# Patient Record
Sex: Female | Born: 1970 | Race: Black or African American | Hispanic: No | Marital: Single | Smoking: Never smoker
Health system: Southern US, Community
[De-identification: ages and names within clinical notes are randomized; demographics above are authoritative.]

## PROBLEM LIST (undated history)

## (undated) DIAGNOSIS — I10 Essential (primary) hypertension: Secondary | ICD-10-CM

## (undated) DIAGNOSIS — J45909 Unspecified asthma, uncomplicated: Secondary | ICD-10-CM

## (undated) DIAGNOSIS — E559 Vitamin D deficiency, unspecified: Secondary | ICD-10-CM

## (undated) DIAGNOSIS — K219 Gastro-esophageal reflux disease without esophagitis: Secondary | ICD-10-CM

## (undated) DIAGNOSIS — T7840XA Allergy, unspecified, initial encounter: Secondary | ICD-10-CM

## (undated) DIAGNOSIS — R7303 Prediabetes: Secondary | ICD-10-CM

## (undated) DIAGNOSIS — E079 Disorder of thyroid, unspecified: Secondary | ICD-10-CM

## (undated) DIAGNOSIS — E119 Type 2 diabetes mellitus without complications: Secondary | ICD-10-CM

## (undated) DIAGNOSIS — E785 Hyperlipidemia, unspecified: Secondary | ICD-10-CM

## (undated) HISTORY — DX: Disorder of thyroid, unspecified: E07.9

## (undated) HISTORY — DX: Hyperlipidemia, unspecified: E78.5

## (undated) HISTORY — PX: OTHER SURGICAL HISTORY: SHX169

## (undated) HISTORY — PX: ABDOMINAL HYSTERECTOMY: SHX81

## (undated) HISTORY — DX: Allergy, unspecified, initial encounter: T78.40XA

## (undated) HISTORY — DX: Type 2 diabetes mellitus without complications: E11.9

## (undated) HISTORY — DX: Gastro-esophageal reflux disease without esophagitis: K21.9

## (undated) HISTORY — PX: WISDOM TOOTH EXTRACTION: SHX21

## (undated) HISTORY — DX: Vitamin D deficiency, unspecified: E55.9

## (undated) HISTORY — PX: ROUX-EN-Y GASTRIC BYPASS: SHX1104

## (undated) HISTORY — DX: Unspecified asthma, uncomplicated: J45.909

## (undated) HISTORY — DX: Essential (primary) hypertension: I10

---
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
## 2019-01-06 ENCOUNTER — Ambulatory Visit: Payer: 59 | Admitting: Sports Medicine

## 2019-01-06 ENCOUNTER — Other Ambulatory Visit: Payer: Self-pay

## 2019-01-06 DIAGNOSIS — M7742 Metatarsalgia, left foot: Secondary | ICD-10-CM | POA: Diagnosis not present

## 2019-01-06 NOTE — Progress Notes (Signed)
[name] Boyd - 48 y.o. female MRN [id number]  Date of birth: Mar 23, 1971   Chief Complaint: Left foot pain  HPI:  48 year old female who presents for 1 year history of intermittent left foot pain.  Pain originally was limited to first MTP joint.  She states that this was a dull ache which intermittently became a pins and needle sensation.  She reports that a few weeks ago the joint became red and "shiny", with increased swelling.  This was more painful than usual.  Shortly thereafter she developed lateral foot pain around her fifth metatarsal head.  Patient states that the pain occurs with activity, but occasionally does hurt her at rest and will often sleep.  She has tried ibuprofen and icy hot which have improved her pain minimally.  Patient also has question about nail discoloration and thickening second toe left side.  States this started several months ago.  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  ROS:     See HPI  PERTINENT  PMH / PSH FH / / SH:  Past Medical, Surgical, Social, and Family History Reviewed & Updated in the EMR.  Pertinent findings include:  Hypothyroidism Diabetes Hypertension  OBJECTIVE: BP 134/76   Ht 5\' 7"  (1.702 m)   Wt 258 lb (117 kg)   BMI 40.41 kg/m   Physical Exam:  Vital signs are reviewed.  GEN: Alert and oriented, NAD Pulm: Breathing unlabored PSY: normal mood, congruent affect  MSK: Left foot Inspection: Hallux valgus noted first MTP.  Notable loss of transverse arch.  No increased swelling.  No erythema noted to first MTP. Palpation: Pain relief with deep Palpation to first MTP.  Notable pain to palpation of fifth tarsal head.  Mild crepitus noted with flexion extension of fourth and fifth metatarsal. Range of motion: Range of motion intact to foot flexion, extension, inversion, eversion.  No limited range of motion to abduction, abduction, extension, flexion first toe. Strength: 5/5 strength foot flexion,  extension, inversion, eversion 5 out of 5 strength great toe flexion, extension Stability: Joint laxity on anterior drawer test. Neurovascular: No focal neurologic deficit.  Skin warm and dry.  Cap refill less than 2 seconds. Special test: Joint laxity with anterior drawer, negative talar tilt  Right foot Inspection: Hallux valgus noted first MTP, more mild than left foot.  No loss of transverse arch. No increased swelling.  No erythema noted Palpation: no tenderness or crepitus appreciated on exam Range of motion: Range of motion intact to foot flexion, extension, inversion, eversion.  No limited range of motion to abduction, abduction, extension, flexion first toe. Strength: 5/5 strength foot flexion, extension, inversion, eversion 5 out of 5 strength great toe flexion, extension Stability: no instability noted Neurovascular: No focal neurologic deficit.  Skin warm and dry.  Cap refill less than 2 seconds. Special test: Negative anterior drawer, negative talar tilt  Gait: Notable out toeing of feet with walking.  No Trendelenburg.  ASSESSMENT & PLAN:  1. Left Foot pain Given crepitus on exam and history patient with likely metatarsalgia secondary to osteoarthritis.  Patient symptomatic on first, fourth, fifth toes but likely to soon develop pain in second and third toes as well.  Fitted patient with metatarsal support and Hapad.  Patient to call in 1 week if not getting any support or if interested in custom orthotic.  Patient to follow-up in 1 month for reevaluation. - follow up in 1 month - fitted with metatarsal support  and green insole - topical liniments and anti-inflammatory as needed - patient to call in 1 week if no relief, or wants custom orthotic made  2. Nail overgrowth Likely onychomycosis secondary to moisture tension with her work boots.  Patient to follow-up with PCP for further management.  3.  History of podagra  Unclear if related to gout flare.  History of red  swollen joint with increased pain that resolved certainly makes this etiology a possibility.  Patient to follow-up with PCP if this develops again.  Guadalupe Dawn MD PGY-2 Family Medicine Resident  Fellow Addendum:  I evaluated the patient alongside the resident as noted above.  I agree with the history and assessment plan as noted above.  I agree the physical exam as noted above.    Patient with pain over the metatarsal heads, secondary to metatarsalgia and likely underlying osteoarthritis.  We will see her back for custom orthotics if the inserts help with her foot pain.  [facility] Fellow 01/06/2019 10:03 AM  I was the preceptor for this visit and available for immediate consultation Shellia Cleverly, DO

## 2019-01-07 ENCOUNTER — Encounter: Payer: Self-pay | Admitting: Sports Medicine

## 2019-02-03 ENCOUNTER — Ambulatory Visit: Payer: 59 | Admitting: Sports Medicine

## 2019-02-08 ENCOUNTER — Other Ambulatory Visit: Payer: Self-pay

## 2019-02-08 ENCOUNTER — Encounter: Payer: Self-pay | Admitting: Sports Medicine

## 2019-02-08 ENCOUNTER — Ambulatory Visit (INDEPENDENT_AMBULATORY_CARE_PROVIDER_SITE_OTHER): Payer: 59 | Admitting: Sports Medicine

## 2019-02-08 VITALS — Temp 97.6°F | Ht 67.0 in | Wt 260.0 lb

## 2019-02-08 DIAGNOSIS — M7742 Metatarsalgia, left foot: Secondary | ICD-10-CM

## 2019-02-08 NOTE — Progress Notes (Addendum)
  Virtual Visit via Video Note  I connected with [name] Boyd on 02/08/19 at 10:15 AM EDT by a video enabled telemedicine application and verified that I am speaking with the correct person using two identifiers.   I discussed the limitations of evaluation and management by telemedicine and the availability of in person appointments. The patient expressed understanding and agreed to proceed.  History of Present Illness: [name] presents today for follow-up on left foot pain.  She was last seen in our office on March 11.  Diagnosed with metatarsalgia, she was given a metatarsal pad which was initially helpful but her pain has now returned.  She is localizing pain over the MTP joint as well as laterally along the fifth metatarsal.  She does endorse some mild swelling at the first MTP joint as well.  She takes 800 mg of ibuprofen once a day which is helpful.  She also ices occasionally which tends to be helpful.  We had previously discussed the possibility of custom orthotics at the time of her last office visit.    Observations/Objective: Unable to assess due to the nature of this visit   Assessment and Plan: Persistent left foot pain secondary to metatarsalgia versus first MTP OA  At this point in time, I think it is reasonable to get an x-ray of her left foot specifically to evaluate for any possible first MTP OA.  Phone follow-up with those results when available.  I still think she would benefit from custom orthotics with a metatarsal pad at some point in the near future.  She may continue with 800 mg of ibuprofen prn.  I did reassure her that she may take a second dose later in the day if needed.  She will also continue icing as needed.   Follow Up Instructions:    I discussed the assessment and treatment plan with the patient. The patient was provided an opportunity to ask questions and all were answered. The patient agreed with the plan and demonstrated an understanding of the instructions.    The patient was advised to call back or seek an in-person evaluation if the symptoms worsen or if the condition fails to improve as anticipated.    Shellia Cleverly, DO    Addendum: X-ray reviewed.  There are degenerative changes at the first MTP joint.  Patient's symptoms are currently tolerable.  I did discuss the possibility of a cortisone injection in the future if symptoms warrant.  Follow-up as needed.

## 2019-02-09 ENCOUNTER — Ambulatory Visit
Admission: RE | Admit: 2019-02-09 | Discharge: 2019-02-09 | Disposition: A | Discharge status: Home / Self Care | Payer: 59 | Source: Ambulatory Visit | Attending: Sports Medicine | Admitting: Sports Medicine

## 2019-02-09 DIAGNOSIS — M7742 Metatarsalgia, left foot: Secondary | ICD-10-CM

## 2019-08-29 ENCOUNTER — Other Ambulatory Visit: Payer: Self-pay

## 2019-08-29 ENCOUNTER — Emergency Department (HOSPITAL_COMMUNITY)
Admission: EM | Admit: 2019-08-29 | Discharge: 2019-08-29 | Disposition: A | Discharge status: Home / Self Care | Payer: 59 | Attending: Emergency Medicine | Admitting: Emergency Medicine

## 2019-08-29 ENCOUNTER — Encounter (HOSPITAL_COMMUNITY): Payer: Self-pay

## 2019-08-29 ENCOUNTER — Emergency Department (HOSPITAL_COMMUNITY): Payer: 59

## 2019-08-29 DIAGNOSIS — R000 Tachycardia, unspecified: Secondary | ICD-10-CM | POA: Insufficient documentation

## 2019-08-29 DIAGNOSIS — R002 Palpitations: Secondary | ICD-10-CM

## 2019-08-29 DIAGNOSIS — R079 Chest pain, unspecified: Secondary | ICD-10-CM

## 2019-08-29 DIAGNOSIS — Z79899 Other long term (current) drug therapy: Secondary | ICD-10-CM | POA: Diagnosis not present

## 2019-08-29 DIAGNOSIS — Z7984 Long term (current) use of oral hypoglycemic drugs: Secondary | ICD-10-CM | POA: Diagnosis not present

## 2019-08-29 DIAGNOSIS — R0789 Other chest pain: Secondary | ICD-10-CM | POA: Diagnosis not present

## 2019-08-29 DIAGNOSIS — R0602 Shortness of breath: Secondary | ICD-10-CM | POA: Diagnosis not present

## 2019-08-29 LAB — CBC
HCT: 41.8 % (ref 36.0–46.0)
Hemoglobin: 12.8 g/dL (ref 12.0–15.0)
MCH: 28 pg (ref 26.0–34.0)
MCHC: 30.6 g/dL (ref 30.0–36.0)
MCV: 91.5 fL (ref 80.0–100.0)
Platelets: 224 10*3/uL (ref 150–400)
RBC: 4.57 MIL/uL (ref 3.87–5.11)
RDW: 12.7 % (ref 11.5–15.5)
WBC: 3.7 10*3/uL — ABNORMAL LOW (ref 4.0–10.5)
nRBC: 0 % (ref 0.0–0.2)

## 2019-08-29 LAB — BASIC METABOLIC PANEL
Anion gap: 7 (ref 5–15)
BUN: 13 mg/dL (ref 6–20)
CO2: 27 mmol/L (ref 22–32)
Calcium: 9.3 mg/dL (ref 8.9–10.3)
Chloride: 103 mmol/L (ref 98–111)
Creatinine, Ser: 0.78 mg/dL (ref 0.44–1.00)
GFR calc Af Amer: >60 mL/min (ref >60)
GFR calc non Af Amer: >60 mL/min (ref >60)
Glucose, Bld: 113 mg/dL — ABNORMAL HIGH (ref 70–99)
Potassium: 4.5 mmol/L (ref 3.5–5.1)
Sodium: 137 mmol/L (ref 135–145)

## 2019-08-29 LAB — TROPONIN I (HIGH SENSITIVITY)
Troponin I (High Sensitivity): 2 ng/L (ref <18)
Troponin I (High Sensitivity): 2 ng/L (ref <18)

## 2019-08-29 LAB — D-DIMER, QUANTITATIVE: D-Dimer, Quant: 0.56 ug/mL-FEU — ABNORMAL HIGH (ref 0.00–0.50)

## 2019-08-29 MED ORDER — SODIUM CHLORIDE (PF) 0.9 % IJ SOLN
INTRAMUSCULAR | Status: AC
  Filled 2019-08-29: qty 50

## 2019-08-29 MED ORDER — SODIUM CHLORIDE 0.9% FLUSH: 3.0000 mL | Freq: Once | INTRAVENOUS | Status: DC

## 2019-08-29 MED ORDER — IOHEXOL 350 MG/ML SOLN
100.0000 mL | Freq: Once | INTRAVENOUS | Status: AC | PRN
  Administered 2019-08-29: 100 mL via INTRAVENOUS

## 2019-08-29 NOTE — ED Notes (Signed)
Pt ambulated in room SpO2 was 95%

## 2019-08-29 NOTE — ED Provider Notes (Signed)
[facility] DEPT Provider Note   CSN: [id number] Arrival date & time: 08/29/19  [id number]     History   Chief Complaint Chief Complaint  Patient presents with   Palpitations    HPI [name] Boyd is a 48 y.o. female who presents to the ED today complaining of intermittent palpitations x 3-4 days.  Also complains of intermittent left-sided chest pains.  Reports that she initially thought she was having some indigestion so she took Tums without relief.  She states that the palpitations also make her feel slightly more short of breath especially with exertion.  She has noticed that the chest pain appears to be exacerbated with deep inspiration.  She states that [facility] her here for further evaluation.  Has never had chest pain like this before.  She reports that her father passed away at the age of 42 due to a "massive heart attack."  She reports that he had some cardiac abnormality since the age of 39 but is unable to recall what it was.  She also reports that her brother recently passed away from a aneurysm in his brain.  She reports that she traveled to [place] 2 weeks ago by train to attend the funeral.  She reports that this was an 8-hour train ride one way.  No history DVT/PE.  No estrogen therapy.  No family history of clotting disorder.  Mopped assist.  No active malignancy.  Denies fever, chills, abdominal pain, nausea, vomiting, diaphoresis, leg swelling, any other associated symptoms.       History reviewed. No pertinent past medical history.  There are no active problems to display for this patient.   History reviewed. No pertinent surgical history.   OB History   No obstetric history on file.      Home Medications    Prior to Admission medications   Medication Sig Start Date End Date Taking? Authorizing Provider  metFORMIN (GLUCOPHAGE) 850 MG tablet metformin 850 mg tablet    [provider]  thyroid  (ARMOUR THYROID) 120 MG tablet Armour Thyroid 120 mg tablet    [provider]  valsartan-hydrochlorothiazide (DIOVAN-HCT) 320-25 MG tablet valsartan 320 mg-hydrochlorothiazide 25 mg tablet    [provider]    Family History No family history on file.  Social History Social History   Tobacco Use   Smoking status: Never Smoker   Smokeless tobacco: Never Used  Substance Use Topics   Alcohol use: Not on file   Drug use: Not on file     Allergies   Patient has no known allergies.   Review of Systems Review of Systems  Constitutional: Negative for chills and fever.  HENT: Negative for congestion.   Eyes: Negative for visual disturbance.  Respiratory: Positive for shortness of breath. Negative for cough.   Cardiovascular: Positive for chest pain and palpitations. Negative for leg swelling.  Gastrointestinal: Negative for abdominal pain, constipation, diarrhea, nausea and vomiting.  Genitourinary: Negative for difficulty urinating.  Musculoskeletal: Negative for myalgias.  Skin: Negative for rash.  Neurological: Negative for headaches.     Physical Exam Updated Vital Signs BP 140/79    Pulse 61    Temp 97.6 F (36.4 C)    Resp 16    SpO2 100%   Physical Exam Vitals signs and nursing note reviewed.  Constitutional:      Appearance: She is not ill-appearing or diaphoretic.     Comments: Evaluated patient ambulating back from  bathroom; appears to be mildly out of breath with increased work of breathing  HENT:     Head: Normocephalic and atraumatic.  Eyes:     Conjunctiva/sclera: Conjunctivae normal.  Neck:     Musculoskeletal: Neck supple.  Cardiovascular:     Rate and Rhythm: Regular rhythm. Tachycardia present.     Comments: Initially tachycardic after returning from bathroom but decreased down to the 80's Pulmonary:     Effort: Pulmonary effort is normal.     Breath sounds: Normal breath sounds. No wheezing, rhonchi or rales.  Abdominal:      Palpations: Abdomen is soft.     Tenderness: There is no abdominal tenderness. There is no guarding or rebound.  Musculoskeletal:     Right lower leg: No edema.     Left lower leg: No edema.  Skin:    General: Skin is warm and dry.  Neurological:     Mental Status: She is alert.      ED Treatments / Results  Labs (all labs ordered are listed, but only abnormal results are displayed) Labs Reviewed  BASIC METABOLIC PANEL - Abnormal; Notable for the following components:      Result Value   Glucose, Bld 113 (*)    All other components within normal limits  CBC - Abnormal; Notable for the following components:   WBC 3.7 (*)    All other components within normal limits  D-DIMER, QUANTITATIVE (NOT AT [facility]) - Abnormal; Notable for the following components:   D-Dimer, Quant 0.56 (*)    All other components within normal limits  TROPONIN I (HIGH SENSITIVITY)  TROPONIN I (HIGH SENSITIVITY)    EKG EKG Interpretation  Date/Time:  Sunday August 29 2019 09:31:22 EST Ventricular Rate:  64 PR Interval:    QRS Duration: 95 QT Interval:  419 QTC Calculation: 433 R Axis:   18 Text Interpretation: Sinus rhythm Low voltage, precordial leads No old tracing to compare Confirmed by Lacretia Leigh ([id number]) on 08/29/2019 9:43:35 AM   Radiology Dg Chest 2 View  Result Date: 08/29/2019 CLINICAL DATA:  Palpitations and chest pain. EXAM: CHEST - 2 VIEW COMPARISON:  None. FINDINGS: The heart size and mediastinal contours are within normal limits. Both lungs are clear. The visualized skeletal structures are unremarkable. IMPRESSION: No active cardiopulmonary disease. Electronically Signed   By: Dorise Bullion III M.D   On: 08/29/2019 10:35   Ct Angio Chest Pe W/cm &/or Wo Cm  Result Date: 08/29/2019 CLINICAL DATA:  Palpitations and left-sided chest pain. EXAM: CT ANGIOGRAPHY CHEST WITH CONTRAST TECHNIQUE: Multidetector CT imaging of the chest was performed using the standard protocol during  bolus administration of intravenous contrast. Multiplanar CT image reconstructions and MIPs were obtained to evaluate the vascular anatomy. CONTRAST:  125mL OMNIPAQUE IOHEXOL 350 MG/ML SOLN COMPARISON:  Chest x-ray August 29, 2019 FINDINGS: Cardiovascular: Satisfactory opacification of the pulmonary arteries to the segmental level. No evidence of pulmonary embolism. Normal heart size. No pericardial effusion. Mediastinum/Nodes: No enlarged mediastinal, hilar, or axillary lymph nodes. Thyroid gland, trachea, and esophagus demonstrate no significant findings. Lungs/Pleura: Lungs are clear. No pleural effusion or pneumothorax. Upper Abdomen: No acute abnormality. Musculoskeletal: No chest wall abnormality. No acute or significant osseous findings. Review of the MIP images confirms the above findings. IMPRESSION: 1. No pulmonary emboli.  No cause for symptoms identified. Electronically Signed   By: Dorise Bullion III M.D   On: 08/29/2019 13:54    Procedures Procedures (including critical care time)  Medications Ordered in  ED Medications  sodium chloride flush (NS) 0.9 % injection 3 mL (3 mLs Intravenous Not Given 08/29/19 1124)  sodium chloride (PF) 0.9 % injection (has no administration in time range)  iohexol (OMNIPAQUE) 350 MG/ML injection 100 mL (100 mLs Intravenous Contrast Given 08/29/19 1309)     Initial Impression / Assessment and Plan / ED Course  I have reviewed the triage vital signs and the nursing notes.  Pertinent labs & imaging results that were available during my care of the patient were reviewed by me and considered in my medical decision making (see chart for details).  Clinical Course as of Aug 28 1422  Sun Aug 29, 2019  1234 D-Dimer, Quant(!): 0.56 [MV]    Clinical Course User Index [MV] Eustaquio Maize, [place]   48 year old female who presents to the ED today with palpitations and left-sided chest pain for the past 3 to 4 days.  Recently traveled to and from [place] by  train, 16 hours total in 2 days.  No history of DVT or PE.  No family history of clotting disease.  No estrogen therapy.  Patient's only risk factor is prolonged immobilization with recent travel to [place].  Will obtain D-dimer, if positive will obtain CTA to rule out PE.  Lab work was obtained prior to being seen -KG without ischemic changes.  Chest x-ray clear.  No leukocytosis.  No electrolyte abnormalities.  Initial troponin of 2.  Will repeat.  Doubt ACS today the patient does have risk factor of obesity and diabetes.  She does also report family history of cardiac abnormalities with father.   D dimer slightly elevated at 0.56; given this will proceed to CTA to rule out a PE.   CTA negative at this time for PE; no other acute findings. Upon reevaluation pt still complaining of palpitations despite heart rate in the 60's. Will ambulate patient and ensure heart rate isn't increasing/oxygen saturation remains stable prior to discharge. Pt reports she has a appointment with her PCP in [place], [place] on 11/09; advised to keep. She reports she goes there to visit her mother and sees her doctor while she is there. Strict return precautions have been discussed with patient including worsening pain, worsening shortness of breath, pain radiating to back, syncope. Pt is in agreement with plan at this time.   Pt ambulated with pulse ox remaining above 95%. HR remained within normal limits as well. Have given pt follow up to cardiology for further evaluation. Stable for discharge home.   This note was prepared using Dragon voice recognition software and may include unintentional dictation errors due to the inherent limitations of voice recognition software.      Final Clinical Impressions(s) / ED Diagnoses   Final diagnoses:  Nonspecific chest pain  Palpitations    ED Discharge Orders    None       Eustaquio Maize, PA-C 08/29/19 1423    Lacretia Leigh, MD 08/30/19 [phone]

## 2019-08-29 NOTE — Discharge Instructions (Signed)
Your labwork, EKG, chest xray, and CT scan of your chest were all very reassuring today Please follow up with your PCP in [place], [place] as scheduled on 09/06/2019.  You can also follow up with cardiology in the area for further evaluation.  I would recommend taking Ibuprofen as needed for the pain on the left side of your chest Return to the ED IMMEDIATELY for any worsening symptoms including worsening pain, worsening shortness of breath, tearing chest pain going into your back, vomiting blood, or if you pass out

## 2019-08-29 NOTE — ED Notes (Signed)
Patient transported to CT 

## 2019-08-29 NOTE — ED Triage Notes (Signed)
Pt states for 3-4 days, she has had palpitations and left sided chest pain. Pt initially chalked it up to indigestion, but no relief with tums. Pt went to [facility], who sent her here. Pt states that her brother recently passed from cardiac problems, and is concerned.

## 2019-08-31 DIAGNOSIS — R072 Precordial pain: Secondary | ICD-10-CM | POA: Insufficient documentation

## 2019-08-31 DIAGNOSIS — R002 Palpitations: Secondary | ICD-10-CM | POA: Insufficient documentation

## 2019-08-31 NOTE — Progress Notes (Signed)
Patient referred by [facility] for palpitations, chest pain.  Subjective:   [facility], female    DOB: 1971/10/16, 48 y.o.   MRN: [id number]   Chief Complaint  Patient presents with  . Chest Pain  . Palpitations  . Hospitalization Follow-up     HPI  48 year old [place] American female with hypertension, prediabetes, hypothyroidism, family history of early CAD, with chest pain, palpitations.  Patient was seen in [facility] on 08/29/2019 with complaints of intermittent palpitations and left-sided chest pain.  Work-up including EKG, serial high-sensitivity troponins was unremarkable.  D-dimer was mildly elevated.  Subsequent CTA chest showed no PE or dissection.  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 Patient has experienced episodes of "skipped beat sensation" that occurs several times a day. She does not always have chest pain with these symptoms. On the day of her ED visit, she did have focal left midaxillary pain, which has since resolved. She does not have any chest pain with walking or physical activity.   Patient's father passed of AAA "massive heart attack" at age 71.   Past Medical History:  Diagnosis Date  . Prediabetes   . Thyroid disease     Past Surgical History:  Procedure Laterality Date  . ABDOMINAL HYSTERECTOMY    . CESAREAN SECTION     x 2  . thumb surgery     right thumb    Social History   Socioeconomic History  . Marital status: Single    Spouse name: Not on file  . Number of children: 2  . Years of education: Not on file  . Highest education level: Not on file  Occupational History  . Not on file  Social Needs  . Financial resource strain: Not on file  . Food insecurity    Worry: Not on file    Inability: Not on file  . Transportation needs    Medical: Not on file    Non-medical: Not on file  Tobacco Use  . Smoking status: Never Smoker  . Smokeless tobacco: Never Used  Substance and  Sexual Activity  . Alcohol use: Not on file    Comment: occ  . Drug use: Not on file  . Sexual activity: Not on file  Lifestyle  . Physical activity    Days per week: Not on file    Minutes per session: Not on file  . Stress: Not on file  Relationships  . Social Herbalist on phone: Not on file    Gets together: Not on file    Attends religious service: Not on file    Active member of club or organization: Not on file    Attends meetings of clubs or organizations: Not on file    Relationship status: Not on file  . Intimate partner violence    Fear of current or ex partner: Not on file    Emotionally abused: Not on file    Physically abused: Not on file    Forced sexual activity: Not on file  Other Topics Concern  . Not on file  Social History Narrative  . Not on file     Family History  Problem Relation Age of Onset  . Diabetes Mother   . Hypertension Mother   . Heart attack Father   . Irregular heart beat Sister      Current Outpatient Medications on File Prior to Visit  Medication Sig Dispense Refill  .  ALBUTEROL IN Inhale into the lungs as needed.    . furosemide (LASIX) 40 MG tablet Take 40 mg by mouth as needed.    . metFORMIN (GLUCOPHAGE) 850 MG tablet metformin 850 mg tablet    . thyroid (ARMOUR) 180 MG tablet Take 180 mg by mouth daily.    . valsartan-hydrochlorothiazide (DIOVAN-HCT) 320-25 MG tablet valsartan 320 mg-hydrochlorothiazide 25 mg tablet     No current facility-administered medications on file prior to visit.     Cardiovascular studies:  EKG 09/01/2019: Sinus rhythm 66 bpm. Low voltage in precordial leads.  Old anterior infarct.  Poor R wave progression.  CTA chest 08/31/2019: 1. No pulmonary emboli.  No cause for symptoms identified.  Recent labs: Results for [facility] (MRN [id number]) as of 08/31/2019 14:40  Ref. Range 08/29/2019 10:31 08/29/2019 11:46  Sodium Latest Ref Range: 135 - 145 mmol/L 137   Potassium Latest Ref  Range: 3.5 - 5.1 mmol/L 4.5   Chloride Latest Ref Range: 98 - 111 mmol/L 103   CO2 Latest Ref Range: 22 - 32 mmol/L 27   Glucose Latest Ref Range: 70 - 99 mg/dL 113 (H)   BUN Latest Ref Range: 6 - 20 mg/dL 13   Creatinine Latest Ref Range: 0.44 - 1.00 mg/dL 0.78   Calcium Latest Ref Range: 8.9 - 10.3 mg/dL 9.3   Anion gap Latest Ref Range: 5 - 15  7   GFR, Est Non African American Latest Ref Range: >60 mL/min >60   GFR, Est African American Latest Ref Range: >60 mL/min >60   Troponin I (High Sensitivity) Latest Ref Range: <18 ng/L 2 2   Results for Boyd Boyd (MRN [id number]) as of 08/31/2019 14:40  Ref. Range 08/29/2019 10:31  WBC Latest Ref Range: 4.0 - 10.5 K/uL 3.7 (L)  RBC Latest Ref Range: 3.87 - 5.11 MIL/uL 4.57  Hemoglobin Latest Ref Range: 12.0 - 15.0 g/dL 12.8  HCT Latest Ref Range: 36.0 - 46.0 % 41.8  MCV Latest Ref Range: 80.0 - 100.0 fL 91.5  MCH Latest Ref Range: 26.0 - 34.0 pg 28.0  MCHC Latest Ref Range: 30.0 - 36.0 g/dL 30.6  RDW Latest Ref Range: 11.5 - 15.5 % 12.7  Platelets Latest Ref Range: 150 - 400 K/uL 224  nRBC Latest Ref Range: 0.0 - 0.2 % 0.0   Results for Boyd Boyd (MRN [id number]) as of 08/31/2019 14:40  Ref. Range 08/29/2019 11:46  D-Dimer, Quant Latest Ref Range: 0.00 - 0.50 ug/mL-FEU 0.56 (H)    Review of Systems  Constitution: Negative for decreased appetite, malaise/fatigue, weight gain and weight loss.  HENT: Negative for congestion.   Eyes: Negative for visual disturbance.  Cardiovascular: Positive for chest pain (Occasional) and palpitations. Negative for dyspnea on exertion, leg swelling and syncope.  Respiratory: Negative for cough.   Endocrine: Negative for cold intolerance.  Hematologic/Lymphatic: Does not bruise/bleed easily.  Skin: Negative for itching and rash.  Musculoskeletal: Negative for myalgias.  Gastrointestinal: Negative for abdominal pain, nausea and vomiting.  Genitourinary: Negative for dysuria.  Neurological: Negative  for dizziness and weakness.  Psychiatric/Behavioral: The patient is not nervous/anxious.   All other systems reviewed and are negative.        Vitals:   09/01/19 0851  BP: 129/76  Pulse: 66  Temp: 98.7 F (37.1 C)  SpO2: 98%     Body mass index is 41.35 kg/m. Filed Weights   09/01/19 0851  Weight: 264 lb (119.7 kg)     Objective:   Physical  Exam  Constitutional: She is oriented to person, place, and time. She appears well-developed and well-nourished. No distress.  HENT:  Head: Normocephalic and atraumatic.  Eyes: Pupils are equal, round, and reactive to light. Conjunctivae are normal.  Neck: No JVD present.  Cardiovascular: Normal rate, regular rhythm and intact distal pulses. Frequent extrasystoles are present.  No murmur heard. Pulmonary/Chest: Effort normal and breath sounds normal. She has no wheezes. She has no rales.  Abdominal: Soft. Bowel sounds are normal. There is no rebound.  Musculoskeletal:        General: No edema.  Lymphadenopathy:    She has no cervical adenopathy.  Neurological: She is alert and oriented to person, place, and time. No cranial nerve deficit.  Skin: Skin is warm and dry.  Psychiatric: She has a normal mood and affect.  Nursing note and vitals reviewed.       Assessment & Recommendations:   48 year old [place] American female with hypertension, prediabetes, hypothyroidism, family history of early CAD, with chest pain, palpitations.  Chest pain, palpitations: Chest pain appears nonanginal. 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 I will start her on metoprolol tartarate 25 mg bid. Recommend 24 Hr Holter monitor, and echocardiogram. If symptoms do not improve or chest pain recurs, will then perform exercise treadmill stress test.  Physical activity recommendation  (The Physical Activity Guidelines for Americans. JAMA 2018;Nov 12) At least 150-300 minutes a week of moderate-intensity, or 75-150 minutes a week of vigorous-intensity  aerobic physical activity, or an equivalent combination of moderate- and vigorous-intensity aerobic activity. Adults should perform muscle-strengthening activities on 2 or more days a week. Older adults should do multicomponent physical activity that includes balance training as well as aerobic and muscle-strengthening activities. Benefits of increased physical activity include lower risk of mortality including cardiovascular mortality, lower risk of cardiovascular events and associated risk factors (hypertension and diabetes), and lower risk of many cancers (including bladder, breast, colon, endometrium, esophagus, kidney, lung, and stomach). Additional improvments have been seen in cognition, risk of dementia, anxiety and depression, improved bone health, lower risk of falls, and associated injuries.  Dietary recommendation The 2019 ACC/AHA guidelines promote nutrition as a main fixture of cardiovascular wellness, with a recommendation for a varied diet of fruit, vegetables, fish, legumes, and whole grains (Class I), as well as recommendations to reduce sodium, cholesterol, processed meats, and refined sugars (Class IIa recommendation).10 Sodium intake, a topic of some controversy as of late, is recommended to be kept at 1,500 mg/day or less, far below the average daily intake in the [place] of 3,409 mg/day, and notably below that of previous [place] recommendations for 300mg /day.10,11 For those unable to reach 1,500 mg/day, they recommend at least a reduction of 1000 mg/day.  A [place]-Mediterranean Diet With Intermittent Fasting:  JACC Review Topic of the Week. J Am Coll Cardiol [id number] Pes[place]-Mediterranean diet, it is supplemented with extra-virgin olive oil (EVOO), which is the principle fat source, along with moderate amounts of dairy (particularly yogurt and cheese) and eggs, as well as modest amounts of alcohol consumption (ideally red wine with the evening meal), but few red and processed meats.    Thank you for referring the patient to [place]. Please feel free to contact with any questions.  Nigel Mormon, MD [facility] Pager: [phone] Office: [phone] If no answer Cell [phone]

## 2019-09-01 ENCOUNTER — Other Ambulatory Visit: Payer: Self-pay

## 2019-09-01 ENCOUNTER — Ambulatory Visit: Payer: 59

## 2019-09-01 ENCOUNTER — Ambulatory Visit (INDEPENDENT_AMBULATORY_CARE_PROVIDER_SITE_OTHER): Payer: 59 | Admitting: Cardiology

## 2019-09-01 ENCOUNTER — Encounter: Payer: Self-pay | Admitting: Cardiology

## 2019-09-01 VITALS — BP 129/76 | HR 66 | Temp 98.7°F | Ht 67.0 in | Wt 264.0 lb

## 2019-09-01 DIAGNOSIS — Z13220 Encounter for screening for lipoid disorders: Secondary | ICD-10-CM | POA: Diagnosis not present

## 2019-09-01 DIAGNOSIS — R072 Precordial pain: Secondary | ICD-10-CM | POA: Diagnosis not present

## 2019-09-01 DIAGNOSIS — R002 Palpitations: Secondary | ICD-10-CM | POA: Diagnosis not present

## 2019-09-01 MED ORDER — METOPROLOL TARTRATE 25 MG PO TABS: 25.0000 mg | ORAL_TABLET | Freq: Two times a day (BID) | ORAL | 2 refills | Status: DC

## 2019-09-01 NOTE — Patient Instructions (Signed)
Physical activity recommendation (The Physical Activity Guidelines for Americans. JAMA 2018;Nov 12) At least 150-300 minutes a week of moderate-intensity, or 75-150 minutes a week of vigorous-intensity aerobic physical activity, or an equivalent combination of moderate- and vigorous-intensity aerobic activity. Adults should perform muscle-strengthening activities on 2 or more days a week. Older adults should do multicomponent physical activity that includes balance training as well as aerobic and muscle-strengthening activities. Benefits of increased physical activity include lower risk of mortality including cardiovascular mortality, lower risk of cardiovascular events and associated risk factors (hypertension and diabetes), and lower risk of many cancers (including bladder, breast, colon, endometrium, esophagus, kidney, lung, and stomach). Additional improvments have been seen in cognition, risk of dementia, anxiety and depression, improved bone health, lower risk of falls, and associated injuries.  Dietary recommendation The 2019 ACC/AHA guidelines promote nutrition as a main fixture of cardiovascular wellness, with a recommendation for a varied diet of fruit, vegetables, fish, legumes, and whole grains (Class I), as well as recommendations to reduce sodium, cholesterol, processed meats, and refined sugars (Class IIa recommendation).10 Sodium intake, a topic of some controversy as of late, is recommended to be kept at 1,500 mg/day or less, far below the average daily intake in the [place] of 3,409 mg/day, and notably below that of previous [place] recommendations for 300mg /day.10,11 For those unable to reach 1,500 mg/day, they recommend at least a reduction of 1000 mg/day.  A Pes[place]-Mediterranean Diet With Intermittent Fasting: JACC Review Topic of the Week. J Am Coll Cardiol [id number] Pes[place]-Mediterranean diet, it is supplemented with extra-virgin olive oil (EVOO), which is the principle fat source, along  with moderate amounts of dairy (particularly yogurt and cheese) and eggs, as well as modest amounts of alcohol consumption (ideally red wine with the evening meal), but few red and processed meats.

## 2019-09-02 ENCOUNTER — Other Ambulatory Visit (HOSPITAL_COMMUNITY): Payer: Self-pay | Admitting: Cardiology

## 2019-09-02 DIAGNOSIS — R002 Palpitations: Secondary | ICD-10-CM | POA: Diagnosis not present

## 2019-09-03 ENCOUNTER — Ambulatory Visit (INDEPENDENT_AMBULATORY_CARE_PROVIDER_SITE_OTHER): Payer: 59

## 2019-09-03 ENCOUNTER — Other Ambulatory Visit: Payer: Self-pay

## 2019-09-03 DIAGNOSIS — R002 Palpitations: Secondary | ICD-10-CM | POA: Diagnosis not present

## 2019-09-03 LAB — LIPID PANEL
Chol/HDL Ratio: 2.9 ratio (ref 0.0–4.4)
Cholesterol, Total: 193 mg/dL (ref 100–199)
HDL: 66 mg/dL (ref >39)
LDL Chol Calc (NIH): 118 mg/dL — ABNORMAL HIGH (ref 0–99)
Triglycerides: 45 mg/dL (ref 0–149)
VLDL Cholesterol Cal: 9 mg/dL (ref 5–40)

## 2019-09-07 NOTE — Progress Notes (Signed)
Called pt to inform her about her monitor results.

## 2019-09-22 ENCOUNTER — Telehealth: Payer: 59 | Admitting: Cardiology

## 2019-09-28 DIAGNOSIS — I493 Ventricular premature depolarization: Secondary | ICD-10-CM | POA: Insufficient documentation

## 2019-09-28 NOTE — Progress Notes (Signed)
Patient referred by [facility] for palpitations, chest pain.  Subjective:   [name] Boyd, female    DOB: 06/28/1971, 49 y.o.   MRN: [id number]  I connected with the patient on 09/29/2019 by a video enabled telemedicine application and verified that I am speaking with the correct person using two identifiers.     I discussed the limitations of evaluation and management by telemedicine and the availability of in person appointments. The patient expressed understanding and agreed to proceed.   This visit type was conducted due to national recommendations for restrictions regarding the COVID-19 Pandemic (e.g. social distancing).  This format is felt to be most appropriate for this patient at this time.  All issues noted in this document were discussed and addressed.  No physical exam was performed (except for noted visual exam findings with Tele health visits).  The patient has consented to conduct a Tele health visit and understands insurance will be billed.    Chief Complaint  Patient presents with  . Palpitations    HPI  48 year old [place] American female with hypertension, prediabetes, hypothyroidism, family history of early CAD, with chest pain, palpitations.  Echocardiogram showed structurally normal heart. She was started on metoprolol for symptomatic PVC's.  She has noticed improvement in her symptoms since then.  Blood pressure is elevated today.  She has upcoming visit with her PCP Dr. Abagail Kitchens in [place], [place], next week.  Past Medical History:  Diagnosis Date  . Prediabetes   . Thyroid disease     Past Surgical History:  Procedure Laterality Date  . ABDOMINAL HYSTERECTOMY    . CESAREAN SECTION     x 2  . thumb surgery     right thumb    Social History   Socioeconomic History  . Marital status: Single    Spouse name: Not on file  . Number of children: 2  . Years of education: Not on file  . Highest education level: Not on file  Occupational History  . Not on  file  Social Needs  . Financial resource strain: Not on file  . Food insecurity    Worry: Not on file    Inability: Not on file  . Transportation needs    Medical: Not on file    Non-medical: Not on file  Tobacco Use  . Smoking status: Never Smoker  . Smokeless tobacco: Never Used  Substance and Sexual Activity  . Alcohol use: Not on file    Comment: occ  . Drug use: Not on file  . Sexual activity: Not on file  Lifestyle  . Physical activity    Days per week: Not on file    Minutes per session: Not on file  . Stress: Not on file  Relationships  . Social Herbalist on phone: Not on file    Gets together: Not on file    Attends religious service: Not on file    Active member of club or organization: Not on file    Attends meetings of clubs or organizations: Not on file    Relationship status: Not on file  . Intimate partner violence    Fear of current or ex partner: Not on file    Emotionally abused: Not on file    Physically abused: Not on file    Forced sexual activity: Not on file  Other Topics Concern  . Not on file  Social History Narrative  . Not on file  Family History  Problem Relation Age of Onset  . Diabetes Mother   . Hypertension Mother   . Heart attack Father   . Irregular heart beat Sister      Current Outpatient Medications on File Prior to Visit  Medication Sig Dispense Refill  . ALBUTEROL IN Inhale into the lungs as needed.    . furosemide (LASIX) 40 MG tablet Take 40 mg by mouth as needed.    . metFORMIN (GLUCOPHAGE) 850 MG tablet metformin 850 mg tablet    . metoprolol tartrate (LOPRESSOR) 25 MG tablet Take 1 tablet (25 mg total) by mouth 2 (two) times daily. 60 tablet 2  . thyroid (ARMOUR) 180 MG tablet Take 180 mg by mouth daily.    . valsartan-hydrochlorothiazide (DIOVAN-HCT) 320-25 MG tablet valsartan 320 mg-hydrochlorothiazide 25 mg tablet     No current facility-administered medications on file prior to visit.      Cardiovascular studies:  Echocardiogram 09/03/2019: Left ventricle cavity is normal in size. Mild concentric hypertrophy of the left ventricle. Normal LV systolic function with EF 55%. Normal global wall motion. Normal diastolic filling pattern.  Mild (Grade I) mitral regurgitation. IVC is dilated with respiratory variation. Estimated RA pressure 8 mmHg.  EKG 09/01/2019: Sinus rhythm 66 bpm. Low voltage in precordial leads.  Old anterior infarct.  Poor R wave progression.  CTA chest 08/31/2019: 1. No pulmonary emboli.  No cause for symptoms identified.  Recent labs: Results for Woolman, [name] (MRN [id number]) as of 08/31/2019 14:40  Ref. Range 08/29/2019 10:31 08/29/2019 11:46  Sodium Latest Ref Range: 135 - 145 mmol/L 137   Potassium Latest Ref Range: 3.5 - 5.1 mmol/L 4.5   Chloride Latest Ref Range: 98 - 111 mmol/L 103   CO2 Latest Ref Range: 22 - 32 mmol/L 27   Glucose Latest Ref Range: 70 - 99 mg/dL 113 (H)   BUN Latest Ref Range: 6 - 20 mg/dL 13   Creatinine Latest Ref Range: 0.44 - 1.00 mg/dL 0.78   Calcium Latest Ref Range: 8.9 - 10.3 mg/dL 9.3   Anion gap Latest Ref Range: 5 - 15  7   GFR, Est Non African American Latest Ref Range: >60 mL/min >60   GFR, Est African American Latest Ref Range: >60 mL/min >60   Troponin I (High Sensitivity) Latest Ref Range: <18 ng/L 2 2   Results for Rusher, [name] (MRN [id number]) as of 08/31/2019 14:40  Ref. Range 08/29/2019 10:31  WBC Latest Ref Range: 4.0 - 10.5 K/uL 3.7 (L)  RBC Latest Ref Range: 3.87 - 5.11 MIL/uL 4.57  Hemoglobin Latest Ref Range: 12.0 - 15.0 g/dL 12.8  HCT Latest Ref Range: 36.0 - 46.0 % 41.8  MCV Latest Ref Range: 80.0 - 100.0 fL 91.5  MCH Latest Ref Range: 26.0 - 34.0 pg 28.0  MCHC Latest Ref Range: 30.0 - 36.0 g/dL 30.6  RDW Latest Ref Range: 11.5 - 15.5 % 12.7  Platelets Latest Ref Range: 150 - 400 K/uL 224  nRBC Latest Ref Range: 0.0 - 0.2 % 0.0   Results for Pontius, [name] (MRN [id number]) as of 08/31/2019 14:40   Ref. Range 08/29/2019 11:46  D-Dimer, Quant Latest Ref Range: 0.00 - 0.50 ug/mL-FEU 0.56 (H)    Review of Systems  Constitution: Negative for decreased appetite, malaise/fatigue, weight gain and weight loss.  HENT: Negative for congestion.   Eyes: Negative for visual disturbance.  Cardiovascular: Positive for chest pain (Occasional) and palpitations. Negative for dyspnea on exertion, leg swelling and syncope.  Respiratory:  Negative for cough.   Endocrine: Negative for cold intolerance.  Hematologic/Lymphatic: Does not bruise/bleed easily.  Skin: Negative for itching and rash.  Musculoskeletal: Negative for myalgias.  Gastrointestinal: Negative for abdominal pain, nausea and vomiting.  Genitourinary: Negative for dysuria.  Neurological: Negative for dizziness and weakness.  Psychiatric/Behavioral: The patient is not nervous/anxious.   All other systems reviewed and are negative.        Vitals:   09/29/19 1324  BP: (!) 143/98  Pulse: 79    Objective:   Physical Exam  Constitutional: She is oriented to person, place, and time. She appears well-developed and well-nourished. No distress.  Pulmonary/Chest: Effort normal.  Neurological: She is alert and oriented to person, place, and time.  Psychiatric: She has a normal mood and affect.  Nursing note and vitals reviewed.       Assessment & Recommendations:   48 year old [place] American female with hypertension, prediabetes, hypothyroidism, family history of early CAD, with symptomatic PVCs  Symptomatic PVCs: Clinical improvement on metoprolol tartrate 25 mg twice daily.  Low suspicion for angina at this time.  Hypertension: Suboptimal control today.  Continue regular follow-up with PCP.  Option would be to increase metoprolol dose, or add another agent like amlodipine.  I will see her on as-needed basis, should she have any worsening of her symptoms.  Nigel Mormon, MD [facility] Pager:  [phone] Office: [phone] If no answer Cell [phone]

## 2019-09-29 ENCOUNTER — Telehealth (INDEPENDENT_AMBULATORY_CARE_PROVIDER_SITE_OTHER): Payer: 59 | Admitting: Cardiology

## 2019-09-29 ENCOUNTER — Other Ambulatory Visit: Payer: Self-pay

## 2019-09-29 VITALS — BP 143/98 | HR 79

## 2019-09-29 DIAGNOSIS — I493 Ventricular premature depolarization: Secondary | ICD-10-CM | POA: Diagnosis not present

## 2019-09-29 DIAGNOSIS — R002 Palpitations: Secondary | ICD-10-CM | POA: Diagnosis not present

## 2019-09-29 DIAGNOSIS — R072 Precordial pain: Secondary | ICD-10-CM

## 2019-10-19 ENCOUNTER — Other Ambulatory Visit: Payer: Self-pay

## 2019-10-19 ENCOUNTER — Other Ambulatory Visit: Payer: 59

## 2019-10-19 ENCOUNTER — Ambulatory Visit: Payer: 59 | Attending: Internal Medicine

## 2019-10-19 DIAGNOSIS — Z20822 Contact with and (suspected) exposure to covid-19: Secondary | ICD-10-CM

## 2019-10-20 ENCOUNTER — Other Ambulatory Visit: Payer: 59

## 2019-10-20 LAB — NOVEL CORONAVIRUS, NAA: SARS-CoV-2, NAA: NOT DETECTED

## 2019-11-05 ENCOUNTER — Ambulatory Visit: Payer: 59 | Attending: Internal Medicine

## 2019-11-05 DIAGNOSIS — Z20822 Contact with and (suspected) exposure to covid-19: Secondary | ICD-10-CM

## 2019-11-07 LAB — NOVEL CORONAVIRUS, NAA: SARS-CoV-2, NAA: NOT DETECTED

## 2020-02-14 ENCOUNTER — Other Ambulatory Visit: Payer: Self-pay

## 2020-02-14 DIAGNOSIS — R002 Palpitations: Secondary | ICD-10-CM

## 2020-02-14 MED ORDER — METOPROLOL TARTRATE 25 MG PO TABS: 25.0000 mg | ORAL_TABLET | Freq: Two times a day (BID) | ORAL | 0 refills | Status: DC

## 2020-10-28 HISTORY — PX: UPPER GASTROINTESTINAL ENDOSCOPY: SHX188

## 2020-11-23 ENCOUNTER — Other Ambulatory Visit: Payer: Self-pay

## 2020-11-23 ENCOUNTER — Ambulatory Visit (AMBULATORY_SURGERY_CENTER): Payer: Self-pay

## 2020-11-23 VITALS — Ht 67.0 in | Wt 301.0 lb

## 2020-11-23 DIAGNOSIS — Z1211 Encounter for screening for malignant neoplasm of colon: Secondary | ICD-10-CM

## 2020-11-23 DIAGNOSIS — Z800 Family history of malignant neoplasm of digestive organs: Secondary | ICD-10-CM

## 2020-11-23 MED ORDER — SUTAB 1479-225-188 MG PO TABS: 1.0000 | ORAL_TABLET | ORAL | 0 refills | Status: DC

## 2020-11-23 NOTE — Progress Notes (Signed)
No egg or soy allergy known to patient  No issues with past sedation with any surgeries or procedures No intubation problems in the past  No FH of Malignant Hyperthermia No diet pills per patient No home 02 use per patient  No blood thinners per patient  Pt denies issues with constipation -takes Colace as needed but denies constipation No A fib or A flutter  EMMI video via [place] 19 guidelines implemented in PV today with Pt and RN  Pt is fully vaccinated  for Covid x2; Coupon given to pt in PV today , Code to Pharmacy and  NO PA's for preps discussed with pt in PV today  Due to the COVID-19 pandemic we are asking patients to follow certain guidelines.  Pt aware of COVID protocols and [facility] guidelines

## 2020-12-05 ENCOUNTER — Encounter: Payer: Self-pay | Admitting: Gastroenterology

## 2020-12-06 ENCOUNTER — Encounter: Payer: Self-pay | Admitting: Certified Registered Nurse Anesthetist

## 2020-12-07 ENCOUNTER — Ambulatory Visit (AMBULATORY_SURGERY_CENTER): Payer: 59 | Admitting: Gastroenterology

## 2020-12-07 ENCOUNTER — Encounter: Payer: Self-pay | Admitting: Gastroenterology

## 2020-12-07 ENCOUNTER — Other Ambulatory Visit: Payer: Self-pay

## 2020-12-07 VITALS — BP 149/90 | HR 67 | Temp 98.7°F | Resp 13 | Ht 67.0 in | Wt 301.0 lb

## 2020-12-07 DIAGNOSIS — K621 Rectal polyp: Secondary | ICD-10-CM

## 2020-12-07 DIAGNOSIS — Z1211 Encounter for screening for malignant neoplasm of colon: Secondary | ICD-10-CM | POA: Diagnosis present

## 2020-12-07 DIAGNOSIS — Z800 Family history of malignant neoplasm of digestive organs: Secondary | ICD-10-CM

## 2020-12-07 DIAGNOSIS — D128 Benign neoplasm of rectum: Secondary | ICD-10-CM

## 2020-12-07 MED ORDER — SODIUM CHLORIDE 0.9 % IV SOLN: 500.0000 mL | Freq: Once | INTRAVENOUS | Status: DC

## 2020-12-07 NOTE — Progress Notes (Signed)
Report given to PACU, vss 

## 2020-12-07 NOTE — Patient Instructions (Signed)
Handout given for polyps.  Await pathology results.  YOU HAD AN ENDOSCOPIC PROCEDURE TODAY AT THE [facility]:   Refer to the procedure report that was given to you for any specific questions about what was found during the examination.  If the procedure report does not answer your questions, please call your gastroenterologist to clarify.  If you requested that your care partner not be given the details of your procedure findings, then the procedure report has been included in a sealed envelope for you to review at your convenience later.  YOU SHOULD EXPECT: Some feelings of bloating in the abdomen. Passage of more gas than usual.  Walking can help get rid of the air that was put into your GI tract during the procedure and reduce the bloating. If you had a lower endoscopy (such as a colonoscopy or flexible sigmoidoscopy) you may notice spotting of blood in your stool or on the toilet paper. If you underwent a bowel prep for your procedure, you may not have a normal bowel movement for a few days.  Please Note:  You might notice some irritation and congestion in your nose or some drainage.  This is from the oxygen used during your procedure.  There is no need for concern and it should clear up in a day or so.  SYMPTOMS TO REPORT IMMEDIATELY:   Following lower endoscopy (colonoscopy or flexible sigmoidoscopy):  Excessive amounts of blood in the stool  Significant tenderness or worsening of abdominal pains  Swelling of the abdomen that is new, acute  Fever of 100F or higher  For urgent or emergent issues, a gastroenterologist can be reached at any hour by calling [phone]. Do not use MyChart messaging for urgent concerns.    DIET:  We do recommend a small meal at first, but then you may proceed to your regular diet.  Drink plenty of fluids but you should avoid alcoholic beverages for 24 hours.  ACTIVITY:  You should plan to take it easy for the rest of today and you should  NOT DRIVE or use heavy machinery until tomorrow (because of the sedation medicines used during the test).    FOLLOW UP: Our staff will call the number listed on your records 48-72 hours following your procedure to check on you and address any questions or concerns that you may have regarding the information given to you following your procedure. If we do not reach you, we will leave a message.  We will attempt to reach you two times.  During this call, we will ask if you have developed any symptoms of COVID 19. If you develop any symptoms (ie: fever, flu-like symptoms, shortness of breath, cough etc.) before then, please call [phone].  If you test positive for Covid 19 in the 2 weeks post procedure, please call and report this information to us.    If any biopsies were taken you will be contacted by phone or by letter within the next 1-3 weeks.  Please call us at [phone] if you have not heard about the biopsies in 3 weeks.    SIGNATURES/CONFIDENTIALITY: You and/or your care partner have signed paperwork which will be entered into your electronic medical record.  These signatures attest to the fact that that the information above on your After Visit Summary has been reviewed and is understood.  Full responsibility of the confidentiality of this discharge information lies with you and/or your care-partner. 

## 2020-12-07 NOTE — Progress Notes (Signed)
Called to room to assist during endoscopic procedure.  Patient ID and intended procedure confirmed with present staff. Received instructions for my participation in the procedure from the performing physician.  

## 2020-12-07 NOTE — Progress Notes (Signed)
Medical history reviewed with no changes noted. VS assessed by Eugenie Norrie, RN

## 2020-12-07 NOTE — Op Note (Signed)
[facility] Patient Name: [name] Boyd Procedure Date: 12/07/2020 10:43 AM MRN: [id number] Endoscopist: Thornton Park MD, MD Age: 50 Referring MD:  Date of Birth: 1971/05/21 Gender: Female Account #: [id number] Procedure:                Colonoscopy Indications:              Screening for colon cancer: Family history of                            colorectal cancer in distant relative(s) before age                            35                           Brother with colon cancer at age 13 Medicines:                Monitored Anesthesia Care Procedure:                Pre-Anesthesia Assessment:                           - Prior to the procedure, a History and Physical                            was performed, and patient medications and                            allergies were reviewed. The patient's tolerance of                            previous anesthesia was also reviewed. The risks                            and benefits of the procedure and the sedation                            options and risks were discussed with the patient.                            All questions were answered, and informed consent                            was obtained. Prior Anticoagulants: The patient has                            taken no previous anticoagulant or antiplatelet                            agents. ASA Grade Assessment: III - A patient with                            severe systemic disease. After reviewing the risks  and benefits, the patient was deemed in                            satisfactory condition to undergo the procedure.                           After obtaining informed consent, the colonoscope                            was passed under direct vision. Throughout the                            procedure, the patient's blood pressure, pulse, and                            oxygen saturations were monitored continuously. The                             Olympus [id number]) [id number] was introduced                            through the anus and advanced to the the cecum,                            identified by appendiceal orifice and ileocecal                            valve. A second forward view of the right colon was                            performed. The colonoscopy was technically                            difficult and complex due to a redundant colon,                            significant looping and a tortuous colon.                            Successful completion of the procedure was aided by                            applying abdominal pressure. The patient tolerated                            the procedure well. The quality of the bowel                            preparation was good. The ileocecal valve,                            appendiceal orifice, and rectum were photographed. Scope In: 10:46:20 AM Scope Out: 11:02:03 AM Scope Withdrawal Time: 0 hours 9 minutes 49 seconds  Total Procedure Duration: 0 hours 15 minutes 43 seconds  Findings:                 The perianal and digital rectal examinations were                            normal.                           Three flat polyps were found in the rectum. The                            polyps were 1 to 2 mm in size. These polyps were                            removed with a cold biopsy forceps. Resection and                            retrieval were complete. Estimated blood loss was                            minimal.                           The exam was otherwise without abnormality on                            direct and retroflexion views. Complications:            No immediate complications. Estimated blood loss:                            Minimal. Estimated Blood Loss:     Estimated blood loss was minimal. Impression:               - Three 1 to 2 mm polyps in the rectum, removed                            with a cold biopsy forceps.  Resected and retrieved.                           - The examination was otherwise normal on direct                            and retroflexion views. Recommendation:           - Patient has a contact number available for                            emergencies. The signs and symptoms of potential                            delayed complications were discussed with the                            patient. Return to normal activities tomorrow.  Written discharge instructions were provided to the                            patient.                           - Resume previous diet.                           - Continue present medications.                           - Await pathology results.                           - Repeat colonoscopy date to be determined after                            pending pathology results are reviewed for                            surveillance.                           - Emerging evidence supports eating a diet of                            fruits, vegetables, grains, calcium, and yogurt                            while reducing red meat and alcohol may reduce the                            risk of colon cancer.                           - Thank you for allowing me to be involved in your                            colon cancer prevention. Thornton Park MD, MD 12/07/2020 11:06:49 AM This report has been signed electronically.

## 2020-12-11 ENCOUNTER — Telehealth: Payer: Self-pay

## 2020-12-11 NOTE — Telephone Encounter (Signed)
  Follow up Call-  Call back number 12/07/2020  Post procedure Call Back phone  # [phone]  Permission to leave phone message Yes     Patient questions:   Do you have a fever, pain , or abdominal swelling? No. Pain Score  0 *  Have you tolerated food without any problems? Yes.    Have you been able to return to your normal activities? Yes.    Do you have any questions about your discharge instructions: Diet   No. Medications  No. Follow up visit  No.  Do you have questions or concerns about your Care? No.  Actions: * If pain score is 4 or above: No action needed, pain <4.  1. Have you developed a fever since your procedure? no  2.   Have you had an respiratory symptoms (SOB or cough) since your procedure? no  3.   Have you tested positive for COVID 19 since your procedure no  4.   Have you had any family members/close contacts diagnosed with the COVID 19 since your procedure?  no   If yes to any of these questions please route to Joylene John, RN and Joella Prince, RN

## 2020-12-17 ENCOUNTER — Encounter: Payer: Self-pay | Admitting: Gastroenterology

## 2021-08-17 DIAGNOSIS — J45909 Unspecified asthma, uncomplicated: Secondary | ICD-10-CM | POA: Diagnosis present

## 2021-08-17 DIAGNOSIS — E119 Type 2 diabetes mellitus without complications: Secondary | ICD-10-CM | POA: Insufficient documentation

## 2021-08-17 DIAGNOSIS — I10 Essential (primary) hypertension: Secondary | ICD-10-CM

## 2021-08-17 DIAGNOSIS — G4733 Obstructive sleep apnea (adult) (pediatric): Secondary | ICD-10-CM | POA: Diagnosis present

## 2021-10-09 DIAGNOSIS — Z9884 Bariatric surgery status: Secondary | ICD-10-CM

## 2021-10-12 DIAGNOSIS — K9189 Other postprocedural complications and disorders of digestive system: Secondary | ICD-10-CM | POA: Insufficient documentation

## 2021-10-12 DIAGNOSIS — K289 Gastrojejunal ulcer, unspecified as acute or chronic, without hemorrhage or perforation: Secondary | ICD-10-CM | POA: Diagnosis present

## 2022-02-01 ENCOUNTER — Emergency Department (HOSPITAL_BASED_OUTPATIENT_CLINIC_OR_DEPARTMENT_OTHER): Payer: 59

## 2022-02-01 ENCOUNTER — Encounter (HOSPITAL_BASED_OUTPATIENT_CLINIC_OR_DEPARTMENT_OTHER): Payer: Self-pay

## 2022-02-01 ENCOUNTER — Inpatient Hospital Stay (HOSPITAL_BASED_OUTPATIENT_CLINIC_OR_DEPARTMENT_OTHER)
Admission: EM | Admit: 2022-02-01 | Discharge: 2022-02-04 | DRG: 641 | Disposition: A | Discharge status: Home / Self Care | Payer: 59 | Attending: Internal Medicine | Admitting: Internal Medicine

## 2022-02-01 ENCOUNTER — Other Ambulatory Visit: Payer: Self-pay

## 2022-02-01 DIAGNOSIS — T85898A Other specified complication of other internal prosthetic devices, implants and grafts, initial encounter: Secondary | ICD-10-CM | POA: Diagnosis not present

## 2022-02-01 DIAGNOSIS — Z79899 Other long term (current) drug therapy: Secondary | ICD-10-CM | POA: Diagnosis not present

## 2022-02-01 DIAGNOSIS — I10 Essential (primary) hypertension: Secondary | ICD-10-CM | POA: Diagnosis not present

## 2022-02-01 DIAGNOSIS — Z90710 Acquired absence of both cervix and uterus: Secondary | ICD-10-CM

## 2022-02-01 DIAGNOSIS — R1013 Epigastric pain: Principal | ICD-10-CM

## 2022-02-01 DIAGNOSIS — G4733 Obstructive sleep apnea (adult) (pediatric): Secondary | ICD-10-CM | POA: Diagnosis not present

## 2022-02-01 DIAGNOSIS — R001 Bradycardia, unspecified: Secondary | ICD-10-CM | POA: Diagnosis not present

## 2022-02-01 DIAGNOSIS — E11649 Type 2 diabetes mellitus with hypoglycemia without coma: Secondary | ICD-10-CM | POA: Diagnosis not present

## 2022-02-01 DIAGNOSIS — Z9884 Bariatric surgery status: Secondary | ICD-10-CM

## 2022-02-01 DIAGNOSIS — E785 Hyperlipidemia, unspecified: Secondary | ICD-10-CM | POA: Diagnosis not present

## 2022-02-01 DIAGNOSIS — E162 Hypoglycemia, unspecified: Secondary | ICD-10-CM

## 2022-02-01 DIAGNOSIS — E876 Hypokalemia: Secondary | ICD-10-CM | POA: Diagnosis not present

## 2022-02-01 DIAGNOSIS — Z8371 Family history of colonic polyps: Secondary | ICD-10-CM | POA: Diagnosis not present

## 2022-02-01 DIAGNOSIS — Z6832 Body mass index (BMI) 32.0-32.9, adult: Secondary | ICD-10-CM

## 2022-02-01 DIAGNOSIS — J45909 Unspecified asthma, uncomplicated: Secondary | ICD-10-CM

## 2022-02-01 DIAGNOSIS — E559 Vitamin D deficiency, unspecified: Secondary | ICD-10-CM | POA: Diagnosis present

## 2022-02-01 DIAGNOSIS — R112 Nausea with vomiting, unspecified: Secondary | ICD-10-CM | POA: Diagnosis not present

## 2022-02-01 DIAGNOSIS — Z800 Family history of malignant neoplasm of digestive organs: Secondary | ICD-10-CM

## 2022-02-01 DIAGNOSIS — R531 Weakness: Secondary | ICD-10-CM | POA: Diagnosis not present

## 2022-02-01 DIAGNOSIS — Z833 Family history of diabetes mellitus: Secondary | ICD-10-CM | POA: Diagnosis not present

## 2022-02-01 DIAGNOSIS — E039 Hypothyroidism, unspecified: Secondary | ICD-10-CM | POA: Diagnosis present

## 2022-02-01 DIAGNOSIS — E669 Obesity, unspecified: Secondary | ICD-10-CM | POA: Diagnosis present

## 2022-02-01 DIAGNOSIS — K289 Gastrojejunal ulcer, unspecified as acute or chronic, without hemorrhage or perforation: Secondary | ICD-10-CM

## 2022-02-01 DIAGNOSIS — D72819 Decreased white blood cell count, unspecified: Secondary | ICD-10-CM | POA: Diagnosis present

## 2022-02-01 DIAGNOSIS — K219 Gastro-esophageal reflux disease without esophagitis: Secondary | ICD-10-CM | POA: Diagnosis not present

## 2022-02-01 DIAGNOSIS — Z8249 Family history of ischemic heart disease and other diseases of the circulatory system: Secondary | ICD-10-CM | POA: Diagnosis not present

## 2022-02-01 LAB — CBC WITH DIFFERENTIAL/PLATELET
Abs Immature Granulocytes: 0 10*3/uL (ref 0.00–0.07)
Basophils Absolute: 0 10*3/uL (ref 0.0–0.1)
Basophils Relative: 0 %
Eosinophils Absolute: 0 10*3/uL (ref 0.0–0.5)
Eosinophils Relative: 1 %
HCT: 36.8 % (ref 36.0–46.0)
Hemoglobin: 11.9 g/dL — ABNORMAL LOW (ref 12.0–15.0)
Immature Granulocytes: 0 %
Lymphocytes Relative: 23 %
Lymphs Abs: 0.7 10*3/uL (ref 0.7–4.0)
MCH: 28.1 pg (ref 26.0–34.0)
MCHC: 32.3 g/dL (ref 30.0–36.0)
MCV: 86.8 fL (ref 80.0–100.0)
Monocytes Absolute: 0.3 10*3/uL (ref 0.1–1.0)
Monocytes Relative: 11 %
Neutro Abs: 1.9 10*3/uL (ref 1.7–7.7)
Neutrophils Relative %: 65 %
Platelets: 244 10*3/uL (ref 150–400)
RBC: 4.24 MIL/uL (ref 3.87–5.11)
RDW: 14.4 % (ref 11.5–15.5)
WBC: 3 10*3/uL — ABNORMAL LOW (ref 4.0–10.5)
nRBC: 0 % (ref 0.0–0.2)

## 2022-02-01 LAB — URINALYSIS, ROUTINE W REFLEX MICROSCOPIC
Glucose, UA: NEGATIVE mg/dL
Hgb urine dipstick: NEGATIVE
Ketones, ur: >80 mg/dL — AB
Leukocytes,Ua: NEGATIVE
Nitrite: NEGATIVE
Protein, ur: 30 mg/dL — AB
Specific Gravity, Urine: 1.025 (ref 1.005–1.030)
pH: 6.5 (ref 5.0–8.0)

## 2022-02-01 LAB — MAGNESIUM: Magnesium: 1.7 mg/dL (ref 1.7–2.4)

## 2022-02-01 LAB — COMPREHENSIVE METABOLIC PANEL
ALT: 7 U/L (ref 0–44)
AST: 15 U/L (ref 15–41)
Albumin: 3.6 g/dL (ref 3.5–5.0)
Alkaline Phosphatase: 45 U/L (ref 38–126)
Anion gap: 16 — ABNORMAL HIGH (ref 5–15)
BUN: 7 mg/dL (ref 6–20)
CO2: 30 mmol/L (ref 22–32)
Calcium: 9.1 mg/dL (ref 8.9–10.3)
Chloride: 93 mmol/L — ABNORMAL LOW (ref 98–111)
Creatinine, Ser: 0.58 mg/dL (ref 0.44–1.00)
GFR, Estimated: >60 mL/min (ref >60)
Glucose, Bld: 82 mg/dL (ref 70–99)
Potassium: 2.6 mmol/L — CL (ref 3.5–5.1)
Sodium: 139 mmol/L (ref 135–145)
Total Bilirubin: 0.8 mg/dL (ref 0.3–1.2)
Total Protein: 7.4 g/dL (ref 6.5–8.1)

## 2022-02-01 LAB — LIPASE, BLOOD: Lipase: 25 U/L (ref 11–51)

## 2022-02-01 MED ORDER — ALBUTEROL SULFATE (2.5 MG/3ML) 0.083% IN NEBU: 3.0000 mL | INHALATION_SOLUTION | RESPIRATORY_TRACT | Status: DC | PRN

## 2022-02-01 MED ORDER — SUCRALFATE 1 GM/10ML PO SUSP
1.0000 g | Freq: Once | ORAL | Status: AC
  Administered 2022-02-01: 1 g via ORAL
  Filled 2022-02-01: qty 10

## 2022-02-01 MED ORDER — ONDANSETRON HCL 4 MG PO TABS: 4.0000 mg | ORAL_TABLET | Freq: Four times a day (QID) | ORAL | Status: DC | PRN

## 2022-02-01 MED ORDER — PANTOPRAZOLE SODIUM 40 MG IV SOLR
40.0000 mg | Freq: Once | INTRAVENOUS | Status: AC
  Administered 2022-02-01: 40 mg via INTRAVENOUS
  Filled 2022-02-01 (×2): qty 10

## 2022-02-01 MED ORDER — ONDANSETRON HCL 4 MG/2ML IJ SOLN
4.0000 mg | Freq: Four times a day (QID) | INTRAMUSCULAR | Status: DC | PRN
  Administered 2022-02-04: 4 mg via INTRAVENOUS
  Filled 2022-02-01: qty 2

## 2022-02-01 MED ORDER — SODIUM CHLORIDE 0.9 % IV SOLN: INTRAVENOUS | Status: AC

## 2022-02-01 MED ORDER — ALUM & MAG HYDROXIDE-SIMETH 200-200-20 MG/5ML PO SUSP
30.0000 mL | Freq: Once | ORAL | Status: AC
  Administered 2022-02-01: 30 mL via ORAL
  Filled 2022-02-01: qty 30

## 2022-02-01 MED ORDER — IOHEXOL 300 MG/ML  SOLN
100.0000 mL | Freq: Once | INTRAMUSCULAR | Status: AC | PRN
  Administered 2022-02-01: 100 mL via INTRAVENOUS

## 2022-02-01 MED ORDER — LIDOCAINE VISCOUS HCL 2 % MT SOLN
15.0000 mL | Freq: Once | OROMUCOSAL | Status: AC
Start: 2022-02-01 — End: 2022-02-01
  Administered 2022-02-01: 15 mL via ORAL
  Filled 2022-02-01: qty 15

## 2022-02-01 MED ORDER — ONDANSETRON HCL 4 MG/2ML IJ SOLN
4.0000 mg | Freq: Once | INTRAMUSCULAR | Status: AC
  Administered 2022-02-01: 4 mg via INTRAVENOUS
  Filled 2022-02-01: qty 2

## 2022-02-01 MED ORDER — POTASSIUM CHLORIDE 10 MEQ/100ML IV SOLN
10.0000 meq | INTRAVENOUS | Status: AC
  Administered 2022-02-01 (×3): 10 meq via INTRAVENOUS
  Filled 2022-02-01 (×3): qty 100

## 2022-02-01 MED ORDER — ACETAMINOPHEN 325 MG PO TABS: 650.0000 mg | ORAL_TABLET | Freq: Four times a day (QID) | ORAL | Status: DC | PRN

## 2022-02-01 MED ORDER — POTASSIUM CHLORIDE 10 MEQ/100ML IV SOLN
10.0000 meq | INTRAVENOUS | Status: AC
  Administered 2022-02-01 (×2): 10 meq via INTRAVENOUS
  Filled 2022-02-01 (×2): qty 100

## 2022-02-01 MED ORDER — POTASSIUM CHLORIDE CRYS ER 20 MEQ PO TBCR
40.0000 meq | EXTENDED_RELEASE_TABLET | Freq: Once | ORAL | Status: AC
  Administered 2022-02-01: 40 meq via ORAL
  Filled 2022-02-01: qty 2

## 2022-02-01 MED ORDER — SODIUM CHLORIDE 0.9 % IV SOLN: INTRAVENOUS | Status: DC | PRN

## 2022-02-01 MED ORDER — POTASSIUM CHLORIDE 10 MEQ/100ML IV SOLN
10.0000 meq | INTRAVENOUS | Status: AC
  Administered 2022-02-01 – 2022-02-02 (×5): 10 meq via INTRAVENOUS
  Filled 2022-02-01 (×5): qty 100

## 2022-02-01 MED ORDER — SODIUM CHLORIDE 0.9% FLUSH
3.0000 mL | Freq: Two times a day (BID) | INTRAVENOUS | Status: DC
  Administered 2022-02-02 – 2022-02-03 (×4): 3 mL via INTRAVENOUS

## 2022-02-01 MED ORDER — LIDOCAINE VISCOUS HCL 2 % MT SOLN
15.0000 mL | Freq: Once | OROMUCOSAL | Status: AC
  Administered 2022-02-01: 15 mL via ORAL
  Filled 2022-02-01: qty 15

## 2022-02-01 MED ORDER — ACETAMINOPHEN 650 MG RE SUPP: 650.0000 mg | Freq: Four times a day (QID) | RECTAL | Status: DC | PRN

## 2022-02-01 MED ORDER — POLYETHYLENE GLYCOL 3350 17 G PO PACK: 17.0000 g | PACK | Freq: Every day | ORAL | Status: DC | PRN

## 2022-02-01 MED ORDER — SODIUM CHLORIDE 0.9 % IV BOLUS
1000.0000 mL | Freq: Once | INTRAVENOUS | Status: AC
  Administered 2022-02-01: 1000 mL via INTRAVENOUS

## 2022-02-01 NOTE — ED Notes (Signed)
ED Provider at bedside. 

## 2022-02-01 NOTE — ED Notes (Signed)
Pt has specimen cup, unable to void at this time but will try when IV fluids finished.  ?

## 2022-02-01 NOTE — Progress Notes (Signed)
Holding CPAP for tonight due to patient having nausea and vomiting ?

## 2022-02-01 NOTE — ED Provider Notes (Signed)
?[facility] ?Provider Note ? ? ?CSN: [id number] ?Arrival date & time: 02/01/22  0859 ? ?  ? ?History ? ?Chief Complaint  ?Patient presents with  ? Nausea  ? Emesis  ? ? ?[name] Boyd is a 51 y.o. female. ? ?Patient is a 51 year old female who presents with abdominal pain.  She has a history of gastric bypass surgery done in October of last year in [place].  She said she was diagnosed with several stomach ulcers following that.  She has had intermittent episodes of burning pain in her epigastrium.  Her most recent episode started a week ago.  Its gotten worse over the last 3 to 4 days.  She has decreased appetite.  She feels overall weak.  She has had some nausea and some spitting up but no vomiting of gastric contents.  No known fevers.  No change in her stools other than she says she has very small stools due to her lack of oral intake overall.  No known fevers.  She was previously seen a gastroenterologist in [facility] but is trying to transition care here. ? ? ?  ? ?Home Medications ?Prior to Admission medications   ?Medication Sig Start Date End Date Taking? Authorizing Provider  ?ALBUTEROL IN Inhale into the lungs as needed. ?Patient not taking: Reported on 12/07/2020    [provider]  ?clotrimazole-betamethasone (LOTRISONE) cream daily as needed. ?Patient not taking: Reported on 12/07/2020    [provider]  ?cyclobenzaprine (FLEXERIL) 5 MG tablet Take by mouth daily as needed. ?Patient not taking: Reported on 12/07/2020 06/21/20   [provider]  ?docusate sodium (COLACE) 100 MG capsule Take by mouth daily as needed. ?Patient not taking: Reported on 12/07/2020 10/08/08   [provider]  ?ergocalciferol (VITAMIN D2) 1.25 MG (50000 UT) capsule Take by mouth daily. 06/06/20   [provider]  ?furosemide (LASIX) 40 MG tablet Take 40 mg by mouth as needed. ?Patient not taking: Reported on 12/07/2020     [provider]  ?ibuprofen (ADVIL) 800 MG tablet Take by mouth daily as needed. ?Patient not taking: Reported on 12/07/2020 10/08/08   [provider]  ?metFORMIN (GLUCOPHAGE) 850 MG tablet metformin 850 mg tablet    [provider]  ?metoprolol tartrate (LOPRESSOR) 25 MG tablet Take 1 tablet (25 mg total) by mouth 2 (two) times daily. ?Patient not taking: Reported on 12/07/2020 02/14/20   Nigel Mormon, MD  ?Pediatric Multiple Vit-C-FA (POLY VITAMIN) CHEW Chew 1 tablet by mouth daily. ?Patient not taking: Reported on 12/07/2020    [provider]  ?thyroid (ARMOUR) 180 MG tablet Take 180 mg by mouth daily.    [provider]  ?valsartan-hydrochlorothiazide (DIOVAN-HCT) 320-25 MG tablet valsartan 320 mg-hydrochlorothiazide 25 mg tablet    [provider]  ?   ? ?Allergies    ?Patient has no known allergies.   ? ?Review of Systems   ?Review of Systems  ?Constitutional:  Positive for fatigue. Negative for chills, diaphoresis and fever.  ?HENT:  Negative for congestion, rhinorrhea and sneezing.   ?Eyes: Negative.   ?Respiratory:  Negative for cough, chest tightness and shortness of breath.   ?Cardiovascular:  Negative for chest pain and leg swelling.  ?Gastrointestinal:  Positive for abdominal pain, nausea and vomiting. Negative for blood in stool and diarrhea.  ?Genitourinary:  Negative for difficulty urinating, flank pain, frequency and hematuria.  ?Musculoskeletal:  Negative for arthralgias and back pain.  ?Skin:  Negative for  rash.  ?Neurological:  Negative for dizziness, speech difficulty, weakness, numbness and headaches.  ? ?Physical Exam ?Updated Vital Signs ?BP (!) 158/83   Pulse (!) 50   Temp 98.4 ?F (36.9 ?C) (Oral)   Resp 16   Ht '5\' 7"'$  (1.702 m)   Wt 93.4 kg   SpO2 100%   BMI 32.26 kg/m?  ?Physical Exam ?Constitutional:   ?   Appearance: She is well-developed.  ?HENT:  ?   Head: Normocephalic and atraumatic.  ?Eyes:  ?   Pupils: Pupils are  equal, round, and reactive to light.  ?Cardiovascular:  ?   Rate and Rhythm: Normal rate and regular rhythm.  ?   Heart sounds: Normal heart sounds.  ?Pulmonary:  ?   Effort: Pulmonary effort is normal. No respiratory distress.  ?   Breath sounds: Normal breath sounds. No wheezing or rales.  ?Chest:  ?   Chest wall: No tenderness.  ?Abdominal:  ?   General: Bowel sounds are normal.  ?   Palpations: Abdomen is soft.  ?   Tenderness: There is abdominal tenderness (Tenderness to epigastrium). There is no guarding or rebound.  ?Musculoskeletal:     ?   General: Normal range of motion.  ?   Cervical back: Normal range of motion and neck supple.  ?Lymphadenopathy:  ?   Cervical: No cervical adenopathy.  ?Skin: ?   General: Skin is warm and dry.  ?   Findings: No rash.  ?Neurological:  ?   Mental Status: She is alert and oriented to person, place, and time.  ? ? ?ED Results / Procedures / Treatments   ?Labs ?(all labs ordered are listed, but only abnormal results are displayed) ?Labs Reviewed  ?COMPREHENSIVE METABOLIC PANEL - Abnormal; Notable for the following components:  ?    Result Value  ? Potassium 2.6 (*)   ? Chloride 93 (*)   ? Anion gap 16 (*)   ? All other components within normal limits  ?CBC WITH DIFFERENTIAL/PLATELET - Abnormal; Notable for the following components:  ? WBC 3.0 (*)   ? Hemoglobin 11.9 (*)   ? All other components within normal limits  ?LIPASE, BLOOD  ?MAGNESIUM  ?URINALYSIS, ROUTINE W REFLEX MICROSCOPIC  ? ? ?EKG ?None ? ?Radiology ?No results found. ? ?Procedures ?Procedures  ? ? ?Medications Ordered in ED ?Medications  ?pantoprazole (PROTONIX) injection 40 mg (40 mg Intravenous Not Given 02/01/22 1351)  ?sucralfate (CARAFATE) 1 GM/10ML suspension 1 g (has no administration in time range)  ?sodium chloride 0.9 % bolus 1,000 mL (0 mLs Intravenous Stopped 02/01/22 1446)  ?alum & mag hydroxide-simeth (MAALOX/MYLANTA) 200-200-20 MG/5ML suspension 30 mL (30 mLs Oral Given 02/01/22 1037)  ?  And   ?lidocaine (XYLOCAINE) 2 % viscous mouth solution 15 mL (15 mLs Oral Given 02/01/22 1037)  ?ondansetron (ZOFRAN) injection 4 mg (4 mg Intravenous Given 02/01/22 1025)  ?potassium chloride 10 mEq in 100 mL IVPB (0 mEq Intravenous Stopped 02/01/22 1500)  ?potassium chloride SA (KLOR-CON M) CR tablet 40 mEq (40 mEq Oral Given 02/01/22 1341)  ? ? ?ED Course/ Medical Decision Making/ A&P ?  ?                        ?Medical Decision Making ?Amount and/or Complexity of Data Reviewed ?Labs: ordered. ?Radiology: ordered. ? ?Risk ?OTC drugs. ?Prescription drug management. ?Decision regarding hospitalization. ? ? ?Patient is a 51 year old female who presents with epigastric pain associated with nausea and vomiting.  Her chart was reviewed.  She did have a recent gastric bypass surgery done in October.  She had a recent admission in [place] in December of last year for similar symptoms of upper abdominal pain with nausea and vomiting.  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  She is having similar symptoms of epigastric pain.  She says it feels similar to her prior episodes.  She has not been eating or drinking a lot.  She has had nonbilious, nonbloody emesis.  Her labs show a markedly low potassium at 2.6.  She was started on IV potassium replacement.  She was given a GI cocktail and Protonix as well as IV fluids.  She initially was feeling better but then started having more vomiting.  She was not able to tolerate oral potassium.  I feel given her ongoing symptoms associated with hypokalemia, she would benefit from inpatient treatment.  I do not feel that she needs another CT scan at this point.  She had one in December with similar symptoms that did not show any acute abnormality.  She also got marked relief with a GI cocktail which likely indicates peptic ulcer disease versus gastritis. ? ?I spoke with Dr.  Florene Glen he will admit the patient for further treatment.  He does request repeat IV potassium doses and a CT scan abdomen pelvis.  I will order this.  Dr. Rogene Houston to f/u on this result. ? ?Final Clinical Impression(s) / ED Diag

## 2022-02-01 NOTE — ED Notes (Signed)
Patient given apple juice for PO challenge. Patient unable to tolerate at this time.  ?

## 2022-02-01 NOTE — ED Triage Notes (Signed)
First contact with patient. Patient arrived via triage from home with complaints of ABD pain with N/V - Patient states abdominal pain is a burning sensation and has not been able to keep anything down x 3 days. Patient states she has been diagnosed with a stomach ulcer. Pt is A&OX 4. Respirations even/unlabored  - Patient spitting up saliva during assessment. Patient changed into gown and placed on monitor and call light within reach. Sister at bedside. ? ?

## 2022-02-01 NOTE — Progress Notes (Signed)
51 yo with hx recent roux en y gastric bypass in 07/2021 in [place], [place], as well as hx diabetes, HTN, asthma, hypothyroidism, obesity, etc who presents today to [facility] with epigastric pain.  Per 12/04/2021 care everywhere note by Dr. Jovita Gamma, she's EGD's with marginal ulcers and dilation (most recently 11/22/2021).  She was on PPI and carafate.  Presented to ED with burning epigastric pain, progressive.  Initially felt better with GI cocktail, but subsequently worsened with vomiting.  Called by Dr. Tamera Punt for admission.  Most recent vitals notably for bradycardia, elevated blood pressure.  Labs notable for severe hypokalemia, hypochloremia, leukopenia, anemia.  Requested abdominal imaging and additional IV potassium (s/p 3 runs of 10 meq, threw up 40 meq PO) as well as EKG.  Will place in obs tele.  Awaiting results of CT abdomen pelvis. Started on PPI and sucralfate. ?

## 2022-02-01 NOTE — ED Notes (Signed)
Family at bedside. 

## 2022-02-01 NOTE — H&P (Signed)
?History and Physical  ? ?[name] Trager KGM:[id number] DOB: 1971/02/10 DOA: 02/01/2022 ? ?PCP: Brent General, MD  ? ?Patient coming from: Home ? ?Chief Complaint: Abdominal pain, nausea, vomiting. ? ?HPI: [name] Boyd is a 51 y.o. female with medical history significant of asthma, diabetes, GERD, hyperlipidemia, hypertension, thyroid disease, status post gastric bypass (Roux-en-Y), anastomotic ulcers, OSA presenting with abdominal pain, nausea and vomiting. ? ?As above patient has history of gastric bypass in [facility] last October.  Diagnosed with anastomotic ulcers at that time.  Has had intermittent epigastric burning type pain for a while.  Most recent episode started a week ago and has been worse for last 3 to 4 days.  She had decreased p.o. intake and has some nausea with spitting.  Did not have true vomiting, in the ED.  Denies any constipation or diarrhea.  Last seen by gastroenterology in [place] but is trying to establish locally. ? ?She denies fevers, chills, chest pain, shortness of breath, constipation, diarrhea. ? ?ED Course: Vital signs in the ED significant for heart rate in the 40s to 50s, blood pressure 644I 347 systolic.  Lab work-up included CMP with potassium 2.6, chloride 93, gap 16.  CBC with stable leukopenia at 3, hemoglobin near baseline 11.9 from a baseline of 13.  Lipase normal.  Urinalysis with small bilirubin, ketones, protein.  CT of the abdomen pelvis showed status post Roux-en-Y wife changes, focal wall thickening at gastrojejunal anastomosis that may reflect ulcer, left kidney lesion with recommendation for ultrasound follow-up. ? ?Review of Systems: As per HPI otherwise all other systems reviewed and are negative. ? ?Past Medical History:  ?Diagnosis Date  ? Allergy   ? seasonal allergies  ? Asthma   ? uses inhaler PRN  ? Diabetes mellitus without complication ([place])   ? on meds  ? GERD (gastroesophageal reflux disease)   ? with certain foods-diet controlled  ?  Hyperlipidemia   ? on meds  ? Hypertension   ? on meds  ? Thyroid disease   ? on meds  ? Vitamin D deficiency   ? on meds  ? ? ?Past Surgical History:  ?Procedure Laterality Date  ? ABDOMINAL HYSTERECTOMY    ? CESAREAN SECTION    ? x 2  ? thumb surgery    ? right thumb  ? UPPER GASTROINTESTINAL ENDOSCOPY  10/2020  ? WISDOM TOOTH EXTRACTION    ? ? ?Social History ? reports that she has never smoked. She has never used smokeless tobacco. She reports that she does not use drugs. No history on file for alcohol use. ? ?No Known Allergies ? ?Family History  ?Problem Relation Age of Onset  ? Diabetes Mother   ? Hypertension Mother   ? Heart attack Father   ? Irregular heart beat Sister   ? Colon polyps Brother 36  ? Colon cancer Brother 64  ? Esophageal cancer Neg Hx   ? Rectal cancer Neg Hx   ? Stomach cancer Neg Hx   ?Reviewed on admission ? ?Prior to Admission medications   ?Medication Sig Start Date End Date Taking? Authorizing Provider  ?ondansetron (ZOFRAN-ODT) 4 MG disintegrating tablet Take 4 mg by mouth every 8 (eight) hours. 10/03/21  Yes [provider]  ?sucralfate (CARAFATE) 1 g tablet Take 1 g by mouth 4 (four) times daily. Dissolve in water 12/04/21  Yes [provider]  ?metoprolol tartrate (LOPRESSOR) 25 MG tablet Take 1 tablet (25 mg total) by mouth 2 (two) times daily. ?Patient not taking:  Reported on 12/07/2020 02/14/20   Nigel Mormon, MD  ? ? ?Physical Exam: ?Vitals:  ? 02/01/22 1615 02/01/22 1715 02/01/22 1730 02/01/22 1830  ?BP:  140/84 138/83 (!) 138/111  ?Pulse: (!) 55 (!) 54 (!) 54 (!) 54  ?Resp: '14 15 14 16  '$ ?Temp:    98.2 ?F (36.8 ?C)  ?TempSrc:    Oral  ?SpO2: 100% 97% 99% 100%  ?Weight:      ?Height:      ? ? ?Physical Exam ?Constitutional:   ?   General: She is not in acute distress. ?   Appearance: Normal appearance.  ?HENT:  ?   Head: Normocephalic and atraumatic.  ?   Mouth/Throat:  ?   Mouth: Mucous membranes are moist.  ?   Pharynx: Oropharynx is clear.  ?Eyes:  ?    Extraocular Movements: Extraocular movements intact.  ?   Pupils: Pupils are equal, round, and reactive to light.  ?Cardiovascular:  ?   Rate and Rhythm: Normal rate and regular rhythm.  ?   Pulses: Normal pulses.  ?   Heart sounds: Normal heart sounds.  ?Pulmonary:  ?   Effort: Pulmonary effort is normal. No respiratory distress.  ?   Breath sounds: Normal breath sounds.  ?Abdominal:  ?   General: Bowel sounds are normal. There is no distension.  ?   Palpations: Abdomen is soft.  ?   Tenderness: There is no abdominal tenderness.  ?Musculoskeletal:     ?   General: No swelling or deformity.  ?Skin: ?   General: Skin is warm and dry.  ?Neurological:  ?   General: No focal deficit present.  ?   Mental Status: Mental status is at baseline.  ? ?Labs on Admission: I have personally reviewed following labs and imaging studies ? ?CBC: ?Recent Labs  ?Lab 02/01/22 ?0931  ?WBC 3.0*  ?NEUTROABS 1.9  ?HGB 11.9*  ?HCT 36.8  ?MCV 86.8  ?PLT 244  ? ? ?Basic Metabolic Panel: ?Recent Labs  ?Lab 02/01/22 ?0931  ?NA 139  ?K 2.6*  ?CL 93*  ?CO2 30  ?GLUCOSE 82  ?BUN 7  ?CREATININE 0.58  ?CALCIUM 9.1  ?MG 1.7  ? ? ?GFR: ?Estimated Creatinine Clearance: 100.9 mL/min (by C-G formula based on SCr of 0.58 mg/dL). ? ?Liver Function Tests: ?Recent Labs  ?Lab 02/01/22 ?0931  ?AST 15  ?ALT 7  ?ALKPHOS 45  ?BILITOT 0.8  ?PROT 7.4  ?ALBUMIN 3.6  ? ? ?Urine analysis: ?   ?Component Value Date/Time  ? COLORURINE YELLOW 02/01/2022 1530  ? APPEARANCEUR HAZY (A) 02/01/2022 1530  ? LABSPEC 1.025 02/01/2022 1530  ? PHURINE 6.5 02/01/2022 1530  ? GLUCOSEU NEGATIVE 02/01/2022 1530  ? [place] NEGATIVE 02/01/2022 1530  ? BILIRUBINUR SMALL (A) 02/01/2022 1530  ? KETONESUR >80 (A) 02/01/2022 1530  ? PROTEINUR 30 (A) 02/01/2022 1530  ? NITRITE NEGATIVE 02/01/2022 1530  ? LEUKOCYTESUR NEGATIVE 02/01/2022 1530  ? ? ?Radiological Exams on Admission: ?CT Abdomen Pelvis W Contrast ? ?Result Date: 02/01/2022 ?CLINICAL DATA:  Epigastric pain with nausea and vomiting for  the past 3 days. EXAM: CT ABDOMEN AND PELVIS WITH CONTRAST TECHNIQUE: Multidetector CT imaging of the abdomen and pelvis was performed using the standard protocol following bolus administration of intravenous contrast. RADIATION DOSE REDUCTION: This exam was performed according to the departmental dose-optimization program which includes automated exposure control, adjustment of the mA and/or kV according to patient size and/or use of iterative reconstruction technique. CONTRAST:  172m OMNIPAQUE IOHEXOL  300 MG/ML  SOLN COMPARISON:  None. FINDINGS: Lower chest: No acute abnormality. Hepatobiliary: Prominent focal fat along the falciform ligament. Subcentimeter low-density lesion in segment 4B, too small to characterize. Mildly distended gallbladder without gallstones or wall thickening. No biliary dilatation. Pancreas: Unremarkable. No pancreatic ductal dilatation or surrounding inflammatory changes. Spleen: Normal in size without focal abnormality. Adrenals/Urinary Tract: Adrenal glands are unremarkable. 1.2 cm intermediate density lesion in the left kidney, incompletely characterized. The right kidney is normal. No renal calculi or hydronephrosis. The bladder is decompressed. Stomach/Bowel: Prior Roux-en-Y gastric bypass surgery. Focal wall thickening around the gastrojejunal anastomosis. No obstruction. The small bowel and colon are unremarkable. Normal appendix. Vascular/Lymphatic: No significant vascular findings are present. No enlarged abdominal or pelvic lymph nodes. Reproductive: Status post hysterectomy. 2.8 cm simple appearing right adnexal cyst. No follow-up imaging is recommended. Normal left ovary. Other: Diastasis of the ventral abdominal wall. Small fat containing midline supraumbilical and umbilical hernias. No free fluid or pneumoperitoneum. Musculoskeletal: No acute or significant osseous findings. IMPRESSION: 1. Prior Roux-en-Y gastric bypass surgery with focal wall thickening around the  gastrojejunal anastomosis, potentially reflecting underlying marginal ulcer. 2. 1.2 cm intermediate density lesion in the left kidney, incompletely characterized. Recommend further evaluation with non-emergent ren

## 2022-02-01 NOTE — ED Notes (Signed)
Pt states she feels like the medication is wearing off and her stomach is burning again. Unable to complete PO Challenge. MD aware.  ?

## 2022-02-01 NOTE — ED Notes (Signed)
Patient transported to CT 

## 2022-02-02 DIAGNOSIS — Z6832 Body mass index (BMI) 32.0-32.9, adult: Secondary | ICD-10-CM | POA: Diagnosis not present

## 2022-02-02 DIAGNOSIS — R531 Weakness: Secondary | ICD-10-CM | POA: Diagnosis present

## 2022-02-02 DIAGNOSIS — K219 Gastro-esophageal reflux disease without esophagitis: Secondary | ICD-10-CM | POA: Diagnosis present

## 2022-02-02 DIAGNOSIS — R112 Nausea with vomiting, unspecified: Secondary | ICD-10-CM | POA: Diagnosis not present

## 2022-02-02 DIAGNOSIS — E669 Obesity, unspecified: Secondary | ICD-10-CM | POA: Diagnosis present

## 2022-02-02 DIAGNOSIS — Z833 Family history of diabetes mellitus: Secondary | ICD-10-CM | POA: Diagnosis not present

## 2022-02-02 DIAGNOSIS — D72819 Decreased white blood cell count, unspecified: Secondary | ICD-10-CM | POA: Diagnosis present

## 2022-02-02 DIAGNOSIS — E11649 Type 2 diabetes mellitus with hypoglycemia without coma: Secondary | ICD-10-CM | POA: Diagnosis not present

## 2022-02-02 DIAGNOSIS — Z79899 Other long term (current) drug therapy: Secondary | ICD-10-CM | POA: Diagnosis not present

## 2022-02-02 DIAGNOSIS — E876 Hypokalemia: Secondary | ICD-10-CM | POA: Diagnosis present

## 2022-02-02 DIAGNOSIS — K289 Gastrojejunal ulcer, unspecified as acute or chronic, without hemorrhage or perforation: Secondary | ICD-10-CM | POA: Diagnosis present

## 2022-02-02 DIAGNOSIS — J45909 Unspecified asthma, uncomplicated: Secondary | ICD-10-CM | POA: Diagnosis present

## 2022-02-02 DIAGNOSIS — Z800 Family history of malignant neoplasm of digestive organs: Secondary | ICD-10-CM | POA: Diagnosis not present

## 2022-02-02 DIAGNOSIS — Z8371 Family history of colonic polyps: Secondary | ICD-10-CM | POA: Diagnosis not present

## 2022-02-02 DIAGNOSIS — I10 Essential (primary) hypertension: Secondary | ICD-10-CM | POA: Diagnosis present

## 2022-02-02 DIAGNOSIS — R001 Bradycardia, unspecified: Secondary | ICD-10-CM | POA: Diagnosis present

## 2022-02-02 DIAGNOSIS — Z90710 Acquired absence of both cervix and uterus: Secondary | ICD-10-CM | POA: Diagnosis not present

## 2022-02-02 DIAGNOSIS — Z9884 Bariatric surgery status: Secondary | ICD-10-CM | POA: Diagnosis not present

## 2022-02-02 DIAGNOSIS — E039 Hypothyroidism, unspecified: Secondary | ICD-10-CM | POA: Diagnosis present

## 2022-02-02 DIAGNOSIS — G4733 Obstructive sleep apnea (adult) (pediatric): Secondary | ICD-10-CM | POA: Diagnosis present

## 2022-02-02 DIAGNOSIS — E559 Vitamin D deficiency, unspecified: Secondary | ICD-10-CM | POA: Diagnosis present

## 2022-02-02 DIAGNOSIS — E162 Hypoglycemia, unspecified: Secondary | ICD-10-CM

## 2022-02-02 DIAGNOSIS — E785 Hyperlipidemia, unspecified: Secondary | ICD-10-CM | POA: Diagnosis present

## 2022-02-02 DIAGNOSIS — Z8249 Family history of ischemic heart disease and other diseases of the circulatory system: Secondary | ICD-10-CM | POA: Diagnosis not present

## 2022-02-02 LAB — GLUCOSE, CAPILLARY
Glucose-Capillary: 60 mg/dL — ABNORMAL LOW (ref 70–99)
Glucose-Capillary: 78 mg/dL (ref 70–99)
Glucose-Capillary: 78 mg/dL (ref 70–99)
Glucose-Capillary: 86 mg/dL (ref 70–99)

## 2022-02-02 LAB — CBC
HCT: 34.2 % — ABNORMAL LOW (ref 36.0–46.0)
Hemoglobin: 11 g/dL — ABNORMAL LOW (ref 12.0–15.0)
MCH: 28.7 pg (ref 26.0–34.0)
MCHC: 32.2 g/dL (ref 30.0–36.0)
MCV: 89.3 fL (ref 80.0–100.0)
Platelets: 234 10*3/uL (ref 150–400)
RBC: 3.83 MIL/uL — ABNORMAL LOW (ref 3.87–5.11)
RDW: 14.5 % (ref 11.5–15.5)
WBC: 3 10*3/uL — ABNORMAL LOW (ref 4.0–10.5)
nRBC: 0 % (ref 0.0–0.2)

## 2022-02-02 LAB — BASIC METABOLIC PANEL
Anion gap: 7 (ref 5–15)
BUN: <5 mg/dL — ABNORMAL LOW (ref 6–20)
CO2: 28 mmol/L (ref 22–32)
Calcium: 7.8 mg/dL — ABNORMAL LOW (ref 8.9–10.3)
Chloride: 103 mmol/L (ref 98–111)
Creatinine, Ser: 0.5 mg/dL (ref 0.44–1.00)
GFR, Estimated: >60 mL/min (ref >60)
Glucose, Bld: 95 mg/dL (ref 70–99)
Potassium: 3.2 mmol/L — ABNORMAL LOW (ref 3.5–5.1)
Sodium: 138 mmol/L (ref 135–145)

## 2022-02-02 LAB — COMPREHENSIVE METABOLIC PANEL
ALT: 10 U/L (ref 0–44)
AST: 16 U/L (ref 15–41)
Albumin: 3.1 g/dL — ABNORMAL LOW (ref 3.5–5.0)
Alkaline Phosphatase: 45 U/L (ref 38–126)
Anion gap: 7 (ref 5–15)
BUN: <5 mg/dL — ABNORMAL LOW (ref 6–20)
CO2: 29 mmol/L (ref 22–32)
Calcium: 7.9 mg/dL — ABNORMAL LOW (ref 8.9–10.3)
Chloride: 100 mmol/L (ref 98–111)
Creatinine, Ser: 0.52 mg/dL (ref 0.44–1.00)
GFR, Estimated: >60 mL/min (ref >60)
Glucose, Bld: 62 mg/dL — ABNORMAL LOW (ref 70–99)
Potassium: 2.9 mmol/L — ABNORMAL LOW (ref 3.5–5.1)
Sodium: 136 mmol/L (ref 135–145)
Total Bilirubin: 0.5 mg/dL (ref 0.3–1.2)
Total Protein: 6.9 g/dL (ref 6.5–8.1)

## 2022-02-02 LAB — POTASSIUM: Potassium: 2.9 mmol/L — ABNORMAL LOW (ref 3.5–5.1)

## 2022-02-02 LAB — VITAMIN B12: Vitamin B-12: 320 pg/mL (ref 180–914)

## 2022-02-02 LAB — PHOSPHORUS: Phosphorus: 3 mg/dL (ref 2.5–4.6)

## 2022-02-02 LAB — HIV ANTIBODY (ROUTINE TESTING W REFLEX): HIV Screen 4th Generation wRfx: NONREACTIVE

## 2022-02-02 MED ORDER — POTASSIUM CHLORIDE 20 MEQ PO PACK
20.0000 meq | PACK | Freq: Once | ORAL | Status: AC
  Administered 2022-02-02: 20 meq via ORAL
  Filled 2022-02-02: qty 1

## 2022-02-02 MED ORDER — PANTOPRAZOLE SODIUM 40 MG IV SOLR
40.0000 mg | Freq: Every day | INTRAVENOUS | Status: DC
  Administered 2022-02-02 – 2022-02-04 (×3): 40 mg via INTRAVENOUS
  Filled 2022-02-02 (×3): qty 10

## 2022-02-02 MED ORDER — POTASSIUM CHLORIDE 20 MEQ PO PACK
40.0000 meq | PACK | Freq: Once | ORAL | Status: AC
  Administered 2022-02-02: 40 meq via ORAL
  Filled 2022-02-02: qty 2

## 2022-02-02 MED ORDER — MORPHINE SULFATE (PF) 2 MG/ML IV SOLN: 1.0000 mg | INTRAVENOUS | Status: DC | PRN

## 2022-02-02 MED ORDER — THIAMINE HCL 100 MG/ML IJ SOLN
100.0000 mg | Freq: Every day | INTRAMUSCULAR | Status: DC
  Administered 2022-02-02 – 2022-02-04 (×3): 100 mg via INTRAVENOUS
  Filled 2022-02-02 (×3): qty 2

## 2022-02-02 MED ORDER — CYANOCOBALAMIN 1000 MCG/ML IJ SOLN
1000.0000 ug | Freq: Every day | INTRAMUSCULAR | Status: DC
Start: 2022-02-03 — End: 2022-02-04
  Administered 2022-02-03 – 2022-02-04 (×2): 1000 ug via INTRAMUSCULAR
  Filled 2022-02-02 (×2): qty 1

## 2022-02-02 MED ORDER — POTASSIUM CHLORIDE 2 MEQ/ML IV SOLN: INTRAVENOUS | Status: DC

## 2022-02-02 MED ORDER — POTASSIUM CHLORIDE 10 MEQ/100ML IV SOLN
10.0000 meq | INTRAVENOUS | Status: AC
  Administered 2022-02-02 (×4): 10 meq via INTRAVENOUS
  Filled 2022-02-02 (×4): qty 100

## 2022-02-02 MED ORDER — KCL IN DEXTROSE-NACL 20-5-0.9 MEQ/L-%-% IV SOLN
INTRAVENOUS | Status: DC
  Filled 2022-02-02 (×6): qty 1000

## 2022-02-02 MED ORDER — SUCRALFATE 1 G PO TABS
1.0000 g | ORAL_TABLET | Freq: Four times a day (QID) | ORAL | Status: DC
  Administered 2022-02-02 – 2022-02-04 (×10): 1 g via ORAL
  Filled 2022-02-02 (×10): qty 1

## 2022-02-02 NOTE — Hospital Course (Addendum)
43 yof w/ asthma, diabetes, GERD, hyperlipidemia, hypertension, thyroid disease, status post gastric bypass (Roux-en-Y)/anastomotic ulcers in Oct 2022, OSA on CPAP presenting with abdominal pain, nausea and vomiting .  Since her gastric bypass in October and has had intermittent epigastric burning. Most recent episode started a week ago and has been worse for last 3 to 4 days, w/ decreased p.o. intake and has some nausea with spitting. ?In QM:VHQIO bradycardia, stable BP labs with hypokalemia, stable leukopenia at 3, hemoglobin near baseline 11.9 from a baseline of 13.  Lipase normal.  Urinalysis with small bilirubin, ketones, protein.  CT of the abdomen pelvis showed status post Roux-en-Y, focal wall thickening at gastrojejunal anastomosis that may reflect ulcer, left kidney lesion with recommendation for ultrasound follow-up. ?Patient admitted for intractable nausea vomiting, anastomotic ulcer, hypokalemia asthma.  Patient is medically managed with antiemetics, PPI, liquid diet, diet slowly advanced at this time tolerating soft diet.  She had significant lyte imbalance that was corrected and improved. ?

## 2022-02-02 NOTE — Progress Notes (Signed)
?PROGRESS NOTE ?[name] Boyd  ZSW:[id number] DOB: [birth year] DOA: 02/01/2022 ?PCP: Brent General, MD  ? ?Brief Narrative/Hospital Course: ?66 yof w/ asthma, diabetes, GERD, hyperlipidemia, hypertension, thyroid disease, status post gastric bypass (Roux-en-Y)/anastomotic ulcers in Oct 2022, OSA on CPAP presenting with abdominal pain, nausea and vomiting .  Since her gastric bypass in October and has had intermittent epigastric burning. Most recent episode started a week ago and has been worse for last 3 to 4 days, w/ decreased p.o. intake and has some nausea with spitting. ?In DU:KGURK bradycardia, stable BP labs with hypokalemia, stable leukopenia at 3, hemoglobin near baseline 11.9 from a baseline of 13.  Lipase normal.  Urinalysis with small bilirubin, ketones, protein.  CT of the abdomen pelvis showed status post Roux-en-Y, focal wall thickening at gastrojejunal anastomosis that may reflect ulcer, left kidney lesion with recommendation for ultrasound follow-up. ?Patient admitted for intractable nausea vomiting, anastomotic ulcer, hypokalemia asthma  ?  ?Subjective: ?Seen and examined this morning complains of some nausea but no vomiting. ?Has epigastric discomfort/pain ?Was hypoglycemic this morning in 60s with ongoing severely low potassium ? ?Assessment and Plan: ?Principal Problem: ?  Intractable nausea and vomiting ?Active Problems: ?  Mild asthma ?  Hypertension ?  Anastomotic ulcer S/P gastric bypass ?  OSA (obstructive sleep apnea) ?  S/P gastric bypass ?  Hypokalemia ?  Hypoglycemia ? ?Intractable nausea and vomiting and epigastric abdominal pain ?Anastomotic ulcer S/P gastric bypass: ?Patient has been dealing with intermittent epigastric pain along with nausea vomiting in the setting of anastomotic ulcer reports he had EGD done 2 weeks ago, that still showed ulcer and has been taking Carafate.  CT abdomen in the ED with focal wall thickening around the gastrojejunal anastomosis. keep on clear liquid diet,  increase/advance as tolerated, continue IV fluids given hypoglycemic this morning.  Replete electrolytes, continue antiemetics PPI, thiamine, b12 inj (once b12 drawn). Hopefully resume Carafate soon. ? ?Mild asthma ?OSA: ?Continue CPAP bedtime continue home inhalers ? ?Hypokalemia severely low, in the setting of nausea vomiting and decreased oral intake.  Being replaced again this morning p.o. IV along with KCl and IV fluids.  Repeat K later today and in the morning ? ?Hypoglycemia from poor oral intake and nausea vomiting.  Started D5NS ?  ?Class I Obesity:Patient's Body mass index is 32.26 kg/m?. : Will benefit with PCP follow-up, weight loss  healthy lifestyle and outpatient sleep evaluation. ? ?Hypertension holding home metoprolol due to initial bradycardia in the ED. ? ?Leukopenia: Check B12 level ? ?1.2 cm intermediate density lesion in the left kidney Nonemergent renal ultrasound ? ?DVT prophylaxis: SCDs Start: 02/01/22 2014 ?Code Status:   Code Status: Full Code ?Family Communication: plan of care discussed with patient at bedside. ?Patient status is: Admitted as observation but need to be hospitalized at least need 2 midnight stay for ongoing management of symptomatic nausea vomiting and severe hypokalemia, level of care: Telemetry  ?Patient currently not stable ? ?Dispo: The patient is from: home ?           Anticipated disposition: home ? ?Mobility Assessment (last 72 hours)   ? ? Mobility Assessment   ? ? [place] Name 02/01/22 1952 02/01/22 1830  ?  ?  ?  ? Does patient have an order for bedrest or is patient medically unstable No - Continue assessment No - Continue assessment     ? What is the highest level of mobility based on the progressive mobility assessment? Level 6 (Walks independently in room and  hall) - Balance while walking in room without assist - Complete Level 6 (Walks independently in room and hall) - Balance while walking in room without assist - Complete     ? ?  ?  ? ?  ?   ? ?Objective: ?Vitals last 24 hrs: ?Vitals:  ? Feb 21, 2022 1830 02-21-22 2054 02/02/22 0206 02/02/22 0532  ?BP: (!) 138/111 (!) 138/92 (!) 154/93 (!) 144/86  ?Pulse: (!) 54 (!) 59 61 (!) 51  ?Resp: '16 18 18 18  '$ ?Temp: 98.2 ?F (36.8 ?C) 98 ?F (36.7 ?C) 97.7 ?F (36.5 ?C) 97.8 ?F (36.6 ?C)  ?TempSrc: Oral Oral Oral Oral  ?SpO2: 100% 100% 99% 100%  ?Weight:      ?Height:      ? ?Weight change:  ? ?Physical Examination: ?General exam: AA0x3,older than stated age, weak appearing. ?HEENT:Oral mucosa moist, Ear/Nose WNL grossly, dentition normal. ?Respiratory system: bilaterally clear BS, no use of accessory muscle ?Cardiovascular system: S1 & S2 +, No JVD,. ?Gastrointestinal system: Abdomen soft, mildly tender epigastrium,ND, BS+ ?Nervous System:Alert, awake, moving extremities and grossly nonfocal ?Extremities: LE edema none,distal peripheral pulses palpable.  ?Skin: No rashes,no icterus. ?MSK: Normal muscle bulk,tone, power ? ?Medications reviewed:  ?Scheduled Meds: ? pantoprazole (PROTONIX) IV  40 mg Intravenous Daily  ? sodium chloride flush  3 mL Intravenous Q12H  ? thiamine injection  100 mg Intravenous Daily  ? ?Continuous Infusions: ? sodium chloride    ? dextrose 5 % and 0.9 % NaCl with KCl 20 mEq/L 100 mL/hr at 02/02/22 0902  ? potassium chloride 10 mEq (02/02/22 1328)  ? ? ?  ?Diet Order   ? ?       ?  Diet clear liquid Room service appropriate? Yes; Fluid consistency: Thin  Diet effective now       ?  ? ?  ?  ? ?  ?  ? ?  ?  ?  ? ? ?Intake/Output Summary (Last 24 hours) at 02/02/2022 1342 ?Last data filed at 02/02/2022 0438 ?Gross per 24 hour  ?Intake 1795.46 ml  ?Output --  ?Net 1795.46 ml  ? ?Net IO Since Admission: 1,895.46 mL [02/02/22 1342]  ?Wt Readings from Last 3 Encounters:  ?02-21-22 93.4 kg  ?12/07/20 (!) 136.5 kg  ?11/23/20 (!) 136.5 kg  ?  ? ?Unresulted Labs (From admission, onward)  ? ?  Start     Ordered  ? 02/03/22 0500  CBC  Tomorrow morning,   R       ?Question:  Specimen collection method  Answer:   Lab=Lab collect  ? 02/02/22 0839  ? 02/03/22 4599  Basic metabolic panel  Tomorrow morning,   R       ?Question:  Specimen collection method  Answer:  Lab=Lab collect  ? 02/02/22 0839  ? 02/02/22 7741  Basic metabolic panel  Once-Timed,   TIMED       ?Question:  Specimen collection method  Answer:  Lab=Lab collect  ? 02/02/22 1340  ? 02/02/22 1341  Vitamin B12  Add-on,   AD       ?Question:  Specimen collection method  Answer:  Lab=Lab collect  ? 02/02/22 1340  ? ?  ?  ? ?  ?Data Reviewed: I have personally reviewed following labs and imaging studies ?CBC: ?Recent Labs  ?Lab 02/21/2022 ?0931 02/02/22 ?4239  ?WBC 3.0* 3.0*  ?NEUTROABS 1.9  --   ?HGB 11.9* 11.0*  ?HCT 36.8 34.2*  ?MCV 86.8 89.3  ?PLT 244 234  ? ?  Basic Metabolic Panel: ?Recent Labs  ?Lab 02/01/22 ?0931 02/02/22 ?4315 02/02/22 ?0859  ?NA 139 136  --   ?K 2.6* 2.9* 2.9*  ?CL 93* 100  --   ?CO2 30 29  --   ?GLUCOSE 82 62*  --   ?BUN 7 <5*  --   ?CREATININE 0.58 0.52  --   ?CALCIUM 9.1 7.9*  --   ?MG 1.7  --   --   ?PHOS  --  3.0  --   ? ?GFR: ?Estimated Creatinine Clearance: 100.9 mL/min (by C-G formula based on SCr of 0.52 mg/dL). ?Liver Function Tests: ?Recent Labs  ?Lab 02/01/22 ?0931 02/02/22 ?4008  ?AST 15 16  ?ALT 7 10  ?ALKPHOS 45 45  ?BILITOT 0.8 0.5  ?PROT 7.4 6.9  ?ALBUMIN 3.6 3.1*  ? ?Recent Labs  ?Lab 02/01/22 ?0931  ?LIPASE 25  ? ?No results for input(s): AMMONIA in the last 168 hours. ?Coagulation Profile: ?No results for input(s): INR, PROTIME in the last 168 hours. ?BNP (last 3 results) ?No results for input(s): PROBNP in the last 8760 hours. ?HbA1C: ?No results for input(s): HGBA1C in the last 72 hours. ?CBG: ?Recent Labs  ?Lab 02/02/22 ?0842 02/02/22 ?1031  ?GLUCAP 60* 78  ? ?Lipid Profile: ?No results for input(s): CHOL, HDL, LDLCALC, TRIG, CHOLHDL, LDLDIRECT in the last 72 hours. ?Thyroid Function Tests: ?No results for input(s): TSH, T4TOTAL, FREET4, T3FREE, THYROIDAB in the last 72 hours. ?Sepsis Labs: ?No results for input(s):  PROCALCITON, LATICACIDVEN in the last 168 hours. ? ?No results found for this or any previous visit (from the past 240 hour(s)).  ?Antimicrobials: ?Anti-infectives (From admission, onward)  ? ? None  ? ?  ? ?Culture/Microbio

## 2022-02-03 ENCOUNTER — Inpatient Hospital Stay (HOSPITAL_COMMUNITY): Payer: 59

## 2022-02-03 DIAGNOSIS — R112 Nausea with vomiting, unspecified: Secondary | ICD-10-CM | POA: Diagnosis not present

## 2022-02-03 LAB — CBC
HCT: 31.6 % — ABNORMAL LOW (ref 36.0–46.0)
Hemoglobin: 10.2 g/dL — ABNORMAL LOW (ref 12.0–15.0)
MCH: 28.6 pg (ref 26.0–34.0)
MCHC: 32.3 g/dL (ref 30.0–36.0)
MCV: 88.5 fL (ref 80.0–100.0)
Platelets: 210 10*3/uL (ref 150–400)
RBC: 3.57 MIL/uL — ABNORMAL LOW (ref 3.87–5.11)
RDW: 14.5 % (ref 11.5–15.5)
WBC: 2.4 10*3/uL — ABNORMAL LOW (ref 4.0–10.5)
nRBC: 0 % (ref 0.0–0.2)

## 2022-02-03 LAB — BASIC METABOLIC PANEL
Anion gap: 8 (ref 5–15)
BUN: <5 mg/dL — ABNORMAL LOW (ref 6–20)
CO2: 28 mmol/L (ref 22–32)
Calcium: 8.1 mg/dL — ABNORMAL LOW (ref 8.9–10.3)
Chloride: 103 mmol/L (ref 98–111)
Creatinine, Ser: 0.48 mg/dL (ref 0.44–1.00)
GFR, Estimated: >60 mL/min (ref >60)
Glucose, Bld: 105 mg/dL — ABNORMAL HIGH (ref 70–99)
Potassium: 3.2 mmol/L — ABNORMAL LOW (ref 3.5–5.1)
Sodium: 139 mmol/L (ref 135–145)

## 2022-02-03 LAB — GLUCOSE, CAPILLARY
Glucose-Capillary: 91 mg/dL (ref 70–99)
Glucose-Capillary: 109 mg/dL — ABNORMAL HIGH (ref 70–99)
Glucose-Capillary: 110 mg/dL — ABNORMAL HIGH (ref 70–99)

## 2022-02-03 LAB — MAGNESIUM: Magnesium: 1.8 mg/dL (ref 1.7–2.4)

## 2022-02-03 LAB — POTASSIUM: Potassium: 3.5 mmol/L (ref 3.5–5.1)

## 2022-02-03 MED ORDER — ENOXAPARIN SODIUM 40 MG/0.4ML IJ SOSY
40.0000 mg | PREFILLED_SYRINGE | INTRAMUSCULAR | Status: DC
  Administered 2022-02-03: 40 mg via SUBCUTANEOUS
  Filled 2022-02-03: qty 0.4

## 2022-02-03 MED ORDER — POTASSIUM CHLORIDE 10 MEQ/100ML IV SOLN: 10.0000 meq | INTRAVENOUS | Status: DC

## 2022-02-03 MED ORDER — POTASSIUM CHLORIDE 20 MEQ PO PACK: 20.0000 meq | PACK | Freq: Once | ORAL | Status: DC

## 2022-02-03 MED ORDER — POTASSIUM CHLORIDE 10 MEQ/100ML IV SOLN
10.0000 meq | INTRAVENOUS | Status: AC
  Administered 2022-02-03 (×3): 10 meq via INTRAVENOUS
  Filled 2022-02-03 (×3): qty 100

## 2022-02-03 NOTE — Progress Notes (Signed)
Pt unable to tolerate PO potassium at this time. Two small episodes of emesis. Pt reports no further nausea or emesis.  ?

## 2022-02-03 NOTE — Progress Notes (Signed)
Pt refused cpap tonight.  Pt was advised that RT is available all night should she change her mind. 

## 2022-02-03 NOTE — Progress Notes (Signed)
?PROGRESS NOTE ?[name] Boyd  SFK:[id number] DOB: Oct 11, 1971 DOA: 02/01/2022 ?PCP: Brent General, MD  ? 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  Since her gastric bypass in October and has had intermittent epigastric burning. Most recent episode started a week ago and has been worse for last 3 to 4 days, w/ decreased p.o. intake and has some nausea with spitting. ?In [facility] bradycardia, stable BP labs with hypokalemia, stable leukopenia at 3, hemoglobin near baseline 11.9 from a baseline of 13.  Lipase normal.  Urinalysis with small bilirubin, ketones, protein.  CT of the abdomen pelvis showed status post Roux-en-Y, focal wall thickening at gastrojejunal anastomosis that may reflect ulcer, left kidney lesion with recommendation for ultrasound follow-up. ?Patient admitted for intractable nausea vomiting, anastomotic ulcer, hypokalemia asthma  ?  ?Subjective: ?Seen and examined. ?Yesterday noon did not tolerate oral potassium in the evening  ?This morning potassium is still little low but much better 3.2.  Overnight afebrile ?No more hypoglycemia, on ivf ?Still reports she feels much better overall tolerating liquid diet ? ?Assessment and Plan: ?Principal Problem: ?  Intractable nausea and vomiting ?Active Problems: ?  Mild asthma ?  Hypertension ?  Anastomotic ulcer S/P gastric bypass ?  OSA (obstructive sleep apnea) ?  S/P gastric bypass ?  Hypokalemia ?  Hypoglycemia ?  Leukopenia ? ?Intractable nausea and vomiting and epigastric abdominal pain ?Anastomotic ulcer S/P gastric bypass: ?Patient has been dealing with intermittent epigastric pain along with nausea vomiting in the setting of anastomotic ulcer report, reports shes had EGD done 2 weeks ago, that still showed ulcer and has been taking Carafate. CT  abdomen in the ED with focal wall thickening around the gastrojejunal anastomosis. ?Continued symptomatic management with clear liquid diet, advance as tolerated, continue IV fluid hydration electrolyte replacement, PPI, thiamine B12 supplement.   ? ?Mild asthma ?OSA: ?Continue CPAP bedtime and home inhalers ? ?Hypokalemia severely low: Potassium trending up continue on IV KCl in fluids also replete with IV potassium.  Recheck this afternoon.  ?Recent Labs  ?Lab 02/01/22 ?0931 02/02/22 ?6759 02/02/22 ?1638 02/02/22 ?1801 02/03/22 ?0403  ?K 2.6* 2.9* 2.9* 3.2* 3.2*  ?   ?Hypoglycemia from poor oral intake and nausea vomiting.  Started D5NS-resolved. ?  ?Class I Obesity:Patient's Body mass index is 32.26 kg/m?. : Will benefit with PCP follow-up, weight loss  healthy lifestyle and outpatient sleep evaluation. ? ?Hypertension-BP is well controlled.  Holding home metoprolol due to initial bradycardia in the ED. ? ?Leukopenia: Monitor.  B12 insufficiency at 320.  Continue B12 replacement ? ?1.2 cm intermediate density lesion in the left kidney Nonemergent renal ultrasound ordered-ordered-changed to Ceftin masses favored to represent benign cyst but are indeterminate in both on today's ultrasound adjacent CT, Radiology recommend definitive characterization with nonemergent renal MRI-we will advise outpatient follow-up with PCP or urology will obtain MRI ? ?DVT prophylaxis: SCDs Start: 02/01/22 2014. ?Code Status:   Code Status: Full Code ?Family Communication: plan of care discussed with patient at bedside. ?Patient status is: Admitted as observation but need to be hospitalized at least need 2 midnight stay for ongoing management of symptomatic nausea vomiting and severe hypokalemia, level of care: Telemetry  ?Patient currently not stable ? ?Dispo: The patient is from: home ?           Anticipated disposition: home ? ?Mobility Assessment (last 72  hours)   ? ? Mobility Assessment   ? ? [place] Name 02/02/22 2141 02/02/22 0830  02/02/22 0825 02/01/22 1952 02/01/22 1830  ? Does patient have an order for bedrest or is patient medically unstable No - Continue assessment No - Continue assessment No - Continue assessment No - Continue assessment No - Continue assessment  ? What is the highest level of mobility based on the progressive mobility assessment? Level 6 (Walks independently in room and hall) - Balance while walking in room without assist - Complete Level 6 (Walks independently in room and hall) - Balance while walking in room without assist - Complete Level 6 (Walks independently in room and hall) - Balance while walking in room without assist - Complete Level 6 (Walks independently in room and hall) - Balance while walking in room without assist - Complete Level 6 (Walks independently in room and hall) - Balance while walking in room without assist - Complete  ? ?  ?  ? ?  ?  ? ?Objective: ?Vitals last 24 hrs: ?Vitals:  ? 02/02/22 0532 02/02/22 1349 02/02/22 2002 02/03/22 0540  ?BP: (!) 144/86 (!) 145/92 130/83 133/85  ?Pulse: (!) 51 (!) 55 (!) 49 63  ?Resp: '18 18 16 20  '$ ?Temp: 97.8 ?F (36.6 ?C) 98.1 ?F (36.7 ?C) 98.7 ?F (37.1 ?C) 98.4 ?F (36.9 ?C)  ?TempSrc: Oral Oral Oral Oral  ?SpO2: 100% 97% 100% 100%  ?Weight:      ?Height:      ? ?Weight change:  ? ?Physical Examination: ? ?General exam: AA0x3,older than stated age, weak appearing. ?HEENT:Oral mucosa moist, Ear/Nose WNL grossly, dentition normal. ?Respiratory system: bilaterally clear,no use of accessory muscle ?Cardiovascular system: S1 & S2 +, No JVD,. ?Gastrointestinal system: Abdomen soft, mildly tender epigastrium,ND, BS+ ?Nervous System:Alert, awake, moving extremities and grossly nonfocal ?Extremities: edema neg,distal peripheral pulses palpable.  ?Skin: No rashes,no icterus. ?MSK: Normal muscle bulk,tone, power ? ? ?Medications reviewed:  ?Scheduled Meds: ? cyanocobalamin  1,000 mcg Intramuscular Q0600  ? pantoprazole (PROTONIX) IV  40 mg Intravenous Daily  ? sodium  chloride flush  3 mL Intravenous Q12H  ? sucralfate  1 g Oral QID  ? thiamine injection  100 mg Intravenous Daily  ? ?Continuous Infusions: ? sodium chloride    ? dextrose 5 % and 0.9 % NaCl with KCl 20 mEq/L 100 mL/hr at 02/03/22 0541  ? potassium chloride 10 mEq (02/03/22 1006)  ? ? ?  ?Diet Order   ? ?       ?  Diet clear liquid Room service appropriate? Yes; Fluid consistency: Thin  Diet effective now       ?  ? ?  ?  ? ?  ?  ? ?  ?  ?  ? ? ?Intake/Output Summary (Last 24 hours) at 02/03/2022 1059 ?Last data filed at 02/03/2022 0500 ?Gross per 24 hour  ?Intake 2353.76 ml  ?Output --  ?Net 2353.76 ml  ? ?Net IO Since Admission: 4,249.22 mL [02/03/22 1059]  ?Wt Readings from Last 3 Encounters:  ?02/01/22 93.4 kg  ?12/07/20 (!) 136.5 kg  ?11/23/20 (!) 136.5 kg  ?  ? ?Unresulted Labs (From admission, onward)  ? ?  Start     Ordered  ? 02/04/22 0500  CBC  Tomorrow morning,   R       ?Question:  Specimen collection method  Answer:  Lab=Lab collect  ? 02/03/22 0831  ? 02/04/22 2536  Basic metabolic panel  Tomorrow morning,  R       ?Question:  Specimen collection method  Answer:  Lab=Lab collect  ? 02/03/22 0831  ? 02/03/22 1400  Potassium  Once-Timed,   TIMED       ?Question:  Specimen collection method  Answer:  Lab=Lab collect  ? 02/03/22 0830  ? ?  ?  ? ?  ?Data Reviewed: I have personally reviewed following labs and imaging studies ?CBC: ?Recent Labs  ?Lab 03-02-2022 ?0931 02/02/22 ?4008 02/03/22 ?0403  ?WBC 3.0* 3.0* 2.4*  ?NEUTROABS 1.9  --   --   ?HGB 11.9* 11.0* 10.2*  ?HCT 36.8 34.2* 31.6*  ?MCV 86.8 89.3 88.5  ?PLT 244 234 210  ? ?Basic Metabolic Panel: ?Recent Labs  ?Lab Mar 02, 2022 ?0931 02/02/22 ?6761 02/02/22 ?9509 02/02/22 ?1801 02/03/22 ?0403  ?NA 139 136  --  138 139  ?K 2.6* 2.9* 2.9* 3.2* 3.2*  ?CL 93* 100  --  103 103  ?CO2 30 29  --  28 28  ?GLUCOSE 82 62*  --  95 105*  ?BUN 7 <5*  --  <5* <5*  ?CREATININE 0.58 0.52  --  0.50 0.48  ?CALCIUM 9.1 7.9*  --  7.8* 8.1*  ?MG 1.7  --   --   --  1.8  ?PHOS  --   3.0  --   --   --   ? ?GFR: ?Estimated Creatinine Clearance: 100.9 mL/min (by C-G formula based on SCr of 0.48 mg/dL). ?Liver Function Tests: ?Recent Labs  ?Lab 02-Mar-2022 ?0931 02/02/22 ?3267  ?AST 15 16  ?

## 2022-02-04 DIAGNOSIS — R112 Nausea with vomiting, unspecified: Secondary | ICD-10-CM | POA: Diagnosis not present

## 2022-02-04 LAB — GLUCOSE, CAPILLARY
Glucose-Capillary: 85 mg/dL (ref 70–99)
Glucose-Capillary: 106 mg/dL — ABNORMAL HIGH (ref 70–99)
Glucose-Capillary: 84 mg/dL (ref 70–99)

## 2022-02-04 LAB — CBC
HCT: 31.7 % — ABNORMAL LOW (ref 36.0–46.0)
Hemoglobin: 10.2 g/dL — ABNORMAL LOW (ref 12.0–15.0)
MCH: 28.8 pg (ref 26.0–34.0)
MCHC: 32.2 g/dL (ref 30.0–36.0)
MCV: 89.5 fL (ref 80.0–100.0)
Platelets: 205 10*3/uL (ref 150–400)
RBC: 3.54 MIL/uL — ABNORMAL LOW (ref 3.87–5.11)
RDW: 14.7 % (ref 11.5–15.5)
WBC: 2.1 10*3/uL — ABNORMAL LOW (ref 4.0–10.5)
nRBC: 0 % (ref 0.0–0.2)

## 2022-02-04 LAB — BASIC METABOLIC PANEL
Anion gap: 7 (ref 5–15)
BUN: <5 mg/dL — ABNORMAL LOW (ref 6–20)
CO2: 28 mmol/L (ref 22–32)
Calcium: 8 mg/dL — ABNORMAL LOW (ref 8.9–10.3)
Chloride: 104 mmol/L (ref 98–111)
Creatinine, Ser: 0.4 mg/dL — ABNORMAL LOW (ref 0.44–1.00)
GFR, Estimated: >60 mL/min (ref >60)
Glucose, Bld: 105 mg/dL — ABNORMAL HIGH (ref 70–99)
Potassium: 3.1 mmol/L — ABNORMAL LOW (ref 3.5–5.1)
Sodium: 139 mmol/L (ref 135–145)

## 2022-02-04 LAB — POTASSIUM: Potassium: 3.8 mmol/L (ref 3.5–5.1)

## 2022-02-04 MED ORDER — PANTOPRAZOLE SODIUM 40 MG PO TBEC: 40.0000 mg | DELAYED_RELEASE_TABLET | Freq: Every day | ORAL | 0 refills | Status: AC

## 2022-02-04 MED ORDER — POTASSIUM CHLORIDE 20 MEQ PO PACK
20.0000 meq | PACK | Freq: Once | ORAL | Status: AC
  Administered 2022-02-04: 20 meq via ORAL
  Filled 2022-02-04: qty 1

## 2022-02-04 MED ORDER — THIAMINE HCL 100 MG PO TABS: 100.0000 mg | ORAL_TABLET | Freq: Every day | ORAL | 0 refills | Status: AC

## 2022-02-04 MED ORDER — POTASSIUM CHLORIDE 10 MEQ/100ML IV SOLN
10.0000 meq | INTRAVENOUS | Status: AC
  Administered 2022-02-04: 10 meq via INTRAVENOUS
  Filled 2022-02-04 (×2): qty 100

## 2022-02-04 MED ORDER — CYANOCOBALAMIN 500 MCG PO TABS
500.0000 ug | ORAL_TABLET | Freq: Every day | ORAL | 0 refills | Status: AC
Start: 2022-02-04 — End: 2022-03-06

## 2022-02-04 MED ORDER — POTASSIUM CHLORIDE CRYS ER 10 MEQ PO TBCR: 10.0000 meq | EXTENDED_RELEASE_TABLET | Freq: Every day | ORAL | 0 refills | Status: AC

## 2022-02-04 NOTE — Progress Notes (Signed)
?  Transition of Care (TOC) Screening Note ? ? ?Patient Details  ?Name: [name] Boyd ?Date of Birth: 07/23/1971 ? ? ?Transition of Care [facility]) CM/SW Contact:    ?Dessa Phi, RN ?Phone Number: ?02/04/2022, 3:19 PM ? ? ? ?Transition of Care Department [facility]) has reviewed patient and no TOC needs have been identified at this time. We will continue to monitor patient advancement through interdisciplinary progression rounds. If new patient transition needs arise, please place a TOC consult. ?  ? ?

## 2022-02-04 NOTE — Progress Notes (Incomplete)
?PROGRESS NOTE ?[name] Boyd  UVO:[id number] DOB: [birth year] DOA: 02/01/2022 ?PCP: Brent General, MD  ? 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  Since her gastric bypass in October and has had intermittent epigastric burning. Most recent episode started a week ago and has been worse for last 3 to 4 days, w/ decreased p.o. intake and has some nausea with spitting. ?In [facility] bradycardia, stable BP labs with hypokalemia, stable leukopenia at 3, hemoglobin near baseline 11.9 from a baseline of 13.  Lipase normal.  Urinalysis with small bilirubin, ketones, protein.  CT of the abdomen pelvis showed status post Roux-en-Y, focal wall thickening at gastrojejunal anastomosis that may reflect ulcer, left kidney lesion with recommendation for ultrasound follow-up. ?Patient admitted for intractable nausea vomiting, anastomotic ulcer, hypokalemia asthma.  Patient is medically managed with antiemetics, PPI, liquid diet, diet slowly advanced at this time tolerating soft diet.  She had significant lyte imbalance that was corrected and improved.  ?  ?Subjective: ?Seen this am ?Was doing well ?Could not eat well after taking potassium packet ?Not much abdomen pain. ? ?Assessment and Plan: ?Principal Problem: ?  Intractable nausea and vomiting ?Active Problems: ?  Mild asthma ?  Hypertension ?  Anastomotic ulcer S/P gastric bypass ?  OSA (obstructive sleep apnea) ?  S/P gastric bypass ?  Hypokalemia ?  Hypoglycemia ?  Leukopenia ? ?*** ? ?DVT prophylaxis: enoxaparin (LOVENOX) injection 40 mg Start: 02/03/22 1800 ?SCDs Start: 02/01/22 2014. ?Code Status:   Code Status: Full Code ?Family Communication: plan of care discussed with patient at bedside. ?Patient status is: Admitted as observation but need to be hospitalized at  least need 2 midnight stay for ongoing management of symptomatic nausea vomiting and severe hypokalemia, level of care: Telemetry  ?Patient currently not stable ? ?Dispo: The patient is from: home ?           Anticipated disposition: home today or tomorrow. ? ?Mobility Assessment (last 72 hours)   ? ? Mobility Assessment   ? ? [place] Name 02/03/22 2130 02/03/22 1002 02/02/22 2141 02/02/22 0830 02/02/22 0825  ? Does patient have an order for bedrest or is patient medically unstable No - Continue assessment No - Continue assessment No - Continue assessment No - Continue assessment No - Continue assessment  ? What is the highest level of mobility based on the progressive mobility assessment? Level 6 (Walks independently in room and hall) - Balance while walking in room without assist - Complete Level 6 (Walks independently in room and hall) - Balance while walking in room without assist - Complete Level 6 (Walks independently in room and hall) - Balance while walking in room without assist - Complete Level 6 (Walks independently in room and hall) - Balance while walking in room without assist - Complete Level 6 (Walks independently in room and hall) - Balance while walking in room without assist - Complete  ? ? [place] Name 02/01/22 1952 02/01/22 1830  ?  ?  ?  ? Does patient have an order for bedrest or is patient medically unstable No - Continue assessment No - Continue assessment     ? What is the highest level of mobility based on the progressive mobility assessment? Level 6 (Walks independently in room and hall) - Balance while walking in room without assist - Complete Level 6 (Walks independently in room  and hall) - Balance while walking in room without assist - Complete     ? ?  ?  ? ?  ?  ? ?Objective: ?Vitals last 24 hrs: ?Vitals:  ? 02/03/22 1333 02/03/22 1929 02/04/22 0334 02/04/22 0351  ?BP: 135/88 (!) 147/89 140/79   ?Pulse: (!) 47 (!) 50 (!) 44   ?Resp: '16 16 18   '$ ?Temp: 97.9 ?F (36.6 ?C) 98.2 ?F (36.8 ?C) 97.9  ?F (36.6 ?C)   ?TempSrc: Oral Oral Oral   ?SpO2: 100% 100% 100%   ?Weight:    95.1 kg  ?Height:      ? ?Weight change:  ? ?Physical Examination: ?General exam: AA,older than stated age, weak appearing. ?HEENT:Oral mucosa moist, Ear/Nose WNL grossly, dentition normal. ?Respiratory system: bilaterally diminished,no use of accessory muscle ?Cardiovascular system: S1 & S2 +, No JVD,. ?Gastrointestinal system: Abdomen soft, mild tenderness,ND,BS+ ?Nervous System:Alert, awake, moving extremities and grossly nonfocal ?Extremities: edema neg,distal peripheral pulses palpable.  ?Skin: No rashes,no icterus. ?MSK: Normal muscle bulk,tone, power ? ? ? ?Medications reviewed:  ?Scheduled Meds: ? cyanocobalamin  1,000 mcg Intramuscular Q0600  ? enoxaparin (LOVENOX) injection  40 mg Subcutaneous Q24H  ? pantoprazole (PROTONIX) IV  40 mg Intravenous Daily  ? potassium chloride  20 mEq Oral Once  ? sodium chloride flush  3 mL Intravenous Q12H  ? sucralfate  1 g Oral QID  ? thiamine injection  100 mg Intravenous Daily  ? ?Continuous Infusions: ? sodium chloride    ? ? ?  ?Diet Order   ? ?       ?  DIET SOFT Room service appropriate? Yes; Fluid consistency: Thin  Diet effective now       ?  ? ?  ?  ? ?  ?  ? ?  ?  ?  ? ? ?Intake/Output Summary (Last 24 hours) at 02/04/2022 1454 ?Last data filed at 02/04/2022 0844 ?Gross per 24 hour  ?Intake 1596.11 ml  ?Output --  ?Net 1596.11 ml  ? ? ?Net IO Since Admission: 7,008.81 mL [02/04/22 1454]  ?Wt Readings from Last 3 Encounters:  ?02/04/22 95.1 kg  ?12/07/20 (!) 136.5 kg  ?11/23/20 (!) 136.5 kg  ?  ? ?Unresulted Labs (From admission, onward)  ? ?  Start     Ordered  ? 02/05/22 8032  Basic metabolic panel  Tomorrow morning,   R       ?Question:  Specimen collection method  Answer:  Lab=Lab collect  ? 02/04/22 1437  ? 02/04/22 1500  Potassium  Once-Timed,   TIMED       ?Question:  Specimen collection method  Answer:  Lab=Lab collect  ? 02/04/22 1247  ? ?  ?  ? ?  ?Data Reviewed: I have  personally reviewed following labs and imaging studies ?CBC: ?Recent Labs  ?Lab 2022-02-02 ?0931 02/02/22 ?1224 02/03/22 ?0403 02/04/22 ?8250  ?WBC 3.0* 3.0* 2.4* 2.1*  ?NEUTROABS 1.9  --   --   --   ?HGB 11.9* 11.0* 10.2* 10.2*  ?HCT 36.8 34.2* 31.6* 31.7*  ?MCV 86.8 89.3 88.5 89.5  ?PLT 244 234 210 205  ? ? ?Basic Metabolic Panel: ?Recent Labs  ?Lab 02-02-22 ?0931 02/02/22 ?0370 02/02/22 ?4888 02/02/22 ?1801 02/03/22 ?0403 02/03/22 ?1459 02/04/22 ?9169  ?NA 139 136  --  138 139  --  139  ?K 2.6* 2.9* 2.9* 3.2* 3.2* 3.5 3.1*  ?CL 93* 100  --  103 103  --  104  ?CO2 30 29  --  28 28  --  28  ?GLUCOSE 82 62*  --  95 105*  --  105*  ?BUN 7 <5*  --  <5* <5*  --  <5*  ?CREATININE 0.58 0.52  --  0.50 0.48  --  0.40*  ?CALCIUM 9.1 7.9*  --  7.8* 8.1*  --  8.0*  ?MG 1.7  --   --   --  1.8  --   --   ?PHOS  --  3.0  --   --   --   --   --   ? ? ?GFR: ?Estimated Creatinine Clearance: 101.8 mL/min (A) (by C-G formula based on SCr of 0.4 mg/dL (L)). ?Liver Function Tests: ?Recent Labs  ?Lab 02/01/22 ?0931 02/02/22 ?2703  ?AST 15 16  ?ALT 7 10  ?ALKPHOS 45 45  ?BILITOT 0.8 0.5  ?PROT 7.4 6.9  ?ALBUMIN 3.6 3.1*  ? ? ?Recent Labs  ?Lab 02/01/22 ?0931  ?LIPASE 25  ? ? ?No results for input(s): AMMONIA in the last 168 hours. ?Coagulation Profile: ?No results for input(s): INR, PROTIME in the last 168 hours. ?BNP (last 3 results) ?No results for input(s): PROBNP in the last 8760 hours. ?HbA1C: ?No results for input(s): HGBA1C in the last 72 hours. ?CBG: ?Recent Labs  ?Lab 02/03/22 ?0756 02/03/22 ?1330 02/03/22 ?1744 02/04/22 ?0755 02/04/22 ?1137  ?GLUCAP 91 109* 110* 85 106*  ? ? ?Lipid Profile: ?No results for input(s): CHOL, HDL, LDLCALC, TRIG, CHOLHDL, LDLDIRECT in the last 72 hours. ?Thyroid Function Tests: ?No results for input(s): TSH, T4TOTAL, FREET4, T3FREE, THYROIDAB in the last 72 hours. ?Sepsis Labs: ?No results for input(s): PROCALCITON, LATICACIDVEN in the last 168 hours. ? ?No results found for this or any previous visit  (from the past 240 hour(s)).  ?Antimicrobials: ?Anti-infectives (From admission, onward)  ? ? None  ? ?  ? ?Culture/Microbiology ?No results found for: SDES, [place], [place], REPTSTATUS  ?Other culture-see not

## 2022-02-04 NOTE — Discharge Summary (Signed)
Physician Discharge Summary  ?[name] Boyd NOB:[id number] DOB: 1971/06/19 DOA: 02/01/2022 ? ?PCP: Brent General, MD ? ?Admit date: 02/01/2022 ?Discharge date: 02/04/2022 ?Recommendations for Outpatient Follow-up:  ?Follow up with PCP  and gastroenterology in 1 weeks-call for appointment ?Please obtain BMP/CBC in one week ? ?Discharge Dispo: home ?Discharge Condition: Stable ?Code Status:   Code Status: Full Code ?Diet recommendation:  ?Diet Order   ? ?       ?  DIET SOFT Room service appropriate? Yes; Fluid consistency: Thin  Diet effective now       ?  ? ?  ?  ? ?  ?  ? 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  Since her gastric bypass in October and has had intermittent epigastric burning. Most recent episode started a week ago and has been worse for last 3 to 4 days, w/ decreased p.o. intake and has some nausea with spitting. ?In HU:TMLYY bradycardia, stable BP labs with hypokalemia, stable leukopenia at 3, hemoglobin near baseline 11.9 from a baseline of 13.  Lipase normal.  Urinalysis with small bilirubin, ketones, protein.  CT of the abdomen pelvis showed status post Roux-en-Y, focal wall thickening at gastrojejunal anastomosis that may reflect ulcer, left kidney lesion with recommendation for ultrasound follow-up. ?Patient admitted for intractable nausea vomiting, anastomotic ulcer, hypokalemia asthma.  Patient is medically managed with antiemetics, PPI, liquid diet, diet slowly advanced at this time tolerating soft diet.  She had significant lyte imbalance that was corrected and improved.  ?She is stable for discharge home once potassium resulted.  Potassium came back normal and sent home on few days of oral low-dose potassium ?Discharge Diagnoses:  ?Principal Problem: ?  Intractable nausea and vomiting ?Active  Problems: ?  Mild asthma ?  Hypertension ?  Anastomotic ulcer S/P gastric bypass ?  OSA (obstructive sleep apnea) ?  S/P gastric bypass ?  Hypokalemia ?  Hypoglycemia ?  Leukopenia ? ?Intractable nausea and vomiting and epigastric abdominal pain ?Anastomotic ulcer S/P gastric bypass: ?Patient has been dealing with intermittent epigastric pain along with nausea vomiting in the setting of anastomotic ulcer report, reports shes had EGD done 2 weeks ago, that still showed ulcer and has been taking Carafate. CT abdomen in the ED with focal wall thickening around the gastrojejunal anastomosis.  Manage symptomatically improved with PPI IV fluids Zofran by time B12 B1 supplement.  Tolerating soft diet electrolytes stable.She is being discharged home she will follow with PCP and also with gastroenterology ? ?Mild asthma ?OSA: ?Continue CPAP bedtime and home inhalers ?  ?Hypokalemia severely low: Patient has been aggressively repleted.  We will recheck prior to discharge.   ?Recent Labs  ?Lab 02/02/22 ?5035 02/02/22 ?1801 02/03/22 ?0403 02/03/22 ?1459 02/04/22 ?4656  ?K 2.9* 3.2* 3.2* 3.5 3.1*  ?  ? ?Hypoglycemia from poor oral intake and nausea vomiting.  Resolved. ?  ?Class I Obesity:Patient's Body mass index is 32.26 kg/m?. : Will benefit with PCP follow-up, weight loss  healthy lifestyle and outpatient sleep evaluation. ?  ?Hypertension-BP is well controlled.  Holding home metoprolol due to initial bradycardia in the ED. ?  ?Leukopenia: Monitor.  B12 insufficiency at 320.  Continue B12 replacement ?  ?1.2 cm intermediate density lesion in the left kidney-CT scan and ultrasound was ordered here in masses favored to represent benign cyst but are indeterminate in both on  today's ultrasound adjacent CT- so you will need MRI of kidney-some please follow-up with your primary care doctor and discuss about getting it done soon.  I discussed with the patient in detail about this finding and need for  follow-up ? ?Consults: ?none ?Subjective: ?Aaox3, doing well no nausea or vomiting or abdomen pain now ? ?Discharge Exam: ?Vitals:  ? 02/03/22 1929 02/04/22 0334  ?BP: (!) 147/89 140/79  ?Pulse: (!) 50 (!) 44  ?Resp: 16 18  ?Temp: 98.2 ?F (36.8 ?C) 97.9 ?F (36.6 ?C)  ?SpO2: 100% 100%  ? ?General: Pt is alert, awake, not in acute distress ?Cardiovascular: RRR, S1/S2 +, no rubs, no gallops ?Respiratory: CTA bilaterally, no wheezing, no rhonchi ?Abdominal: Soft, NT, ND, bowel sounds + ?Extremities: no edema, no cyanosis ? ?Discharge Instructions ? ?Discharge Instructions   ? ? Discharge instructions   Complete by: As directed ?  ? Follow your primary care doctor and your gastroenterology as well-if you need a new doctor number is provided ? ?You are found to have 1.2 cm intermediate density lesion in the left kidney-CT scan and ultrasound was ordered here in masses favored to represent benign cyst but are indeterminate in both on today's ultrasound adjacent CT- so you will need MRI of kidney-some please follow-up with your primary care doctor and discuss about getting it done soon ? ?Please call call MD or return to ER for similar or worsening recurring problem that brought you to hospital or if any fever,nausea/vomiting,abdominal pain, uncontrolled pain, chest pain,  shortness of breath or any other alarming symptoms. ? ?Please follow-up your doctor as instructed in a week time and call the office for appointment. ? ?Please avoid alcohol, smoking, or any other illicit substance and maintain healthy habits including taking your regular medications as prescribed. ? ?You were cared for by a hospitalist during your hospital stay. If you have any questions about your discharge medications or the care you received while you were in the hospital after you are discharged, you can call the unit and ask to speak with the hospitalist on call if the hospitalist that took care of you is not available. ? ?Once you are discharged,  your primary care physician will handle any further medical issues. Please note that NO REFILLS for any discharge medications will be authorized once you are discharged, as it is imperative that you return to your primary care physician (or establish a relationship with a primary care physician if you do not have one) for your aftercare needs so that they can reassess your need for medications and monitor your lab values  ? Increase activity slowly   Complete by: As directed ?  ? ?  ? ?Allergies as of 02/04/2022   ?No Known Allergies ?  ? ?  ?Medication List  ?  ? ?STOP taking these medications   ? ?metoprolol tartrate 25 MG tablet ?Commonly known as: LOPRESSOR ?  ? ?  ? ?TAKE these medications   ? ?ondansetron 4 MG disintegrating tablet ?Commonly known as: ZOFRAN-ODT ?Take 4 mg by mouth every 8 (eight) hours. ?  ?pantoprazole 40 MG tablet ?Commonly known as: Protonix ?Take 1 tablet (40 mg total) by mouth daily. ?  ?sucralfate 1 g tablet ?Commonly known as: CARAFATE ?Take 1 g by mouth 4 (four) times daily. Dissolve in water ?  ?thiamine 100 MG tablet ?Take 1 tablet (100 mg total) by mouth daily. ?  ?vitamin B-12 500 MCG tablet ?Commonly known as: CYANOCOBALAMIN ?Take 1 tablet (500 mcg  total) by mouth daily. ?  ? ?  ? ? Follow-up Information   ? ? Brent General, MD Follow up in 1 week(s).   ?Specialty: Family Medicine ?Contact information: ?[place] [address] ?[phone] ? ? ?  ?  ? ? [facility] Follow up.   ?Why: Can follow-up with primary care doctor or with  Urology regarding left kidney lesion AND WILL NEED MRI kidney soon ?Contact information: ?[address] ?[place] ?[phone] ? ?  ?  ? ?  ?  ? ?  ? ?No Known Allergies ? ?The results of significant diagnostics from this hospitalization (including imaging, microbiology, ancillary and laboratory) are listed below for reference.   ? ?Microbiology: ?No results found for this or any previous  visit (from the past 240 hour(s)).  ?Procedures/Studies: ?CT Abdomen Pelvis W Contrast ? ?Result Date: 02/01/2022 ?CLINICAL DATA:  Epigastric pain with nausea and vomiting for the past 3 days. EXAM: CT ABDOMEN AND PELVIS WITH CONTRAST TECHNI

## 2022-02-20 ENCOUNTER — Other Ambulatory Visit: Payer: Self-pay | Admitting: Urology

## 2022-02-20 DIAGNOSIS — N281 Cyst of kidney, acquired: Secondary | ICD-10-CM

## 2022-03-22 ENCOUNTER — Ambulatory Visit
Admission: RE | Admit: 2022-03-22 | Discharge: 2022-03-22 | Disposition: A | Discharge status: Home / Self Care | Payer: 59 | Source: Ambulatory Visit | Attending: Urology | Admitting: Urology

## 2022-03-22 DIAGNOSIS — N281 Cyst of kidney, acquired: Secondary | ICD-10-CM

## 2022-03-22 MED ORDER — GADOBENATE DIMEGLUMINE 529 MG/ML IV SOLN
18.0000 mL | Freq: Once | INTRAVENOUS | Status: AC | PRN
  Administered 2022-03-22: 18 mL via INTRAVENOUS

## 2022-05-23 ENCOUNTER — Emergency Department (HOSPITAL_BASED_OUTPATIENT_CLINIC_OR_DEPARTMENT_OTHER)
Admission: EM | Admit: 2022-05-23 | Discharge: 2022-05-23 | Discharge status: Absent without leave | Payer: 59 | Source: Home / Self Care

## 2022-06-03 ENCOUNTER — Emergency Department (HOSPITAL_BASED_OUTPATIENT_CLINIC_OR_DEPARTMENT_OTHER)
Admission: EM | Admit: 2022-06-03 | Discharge: 2022-06-03 | Disposition: A | Discharge status: Home / Self Care | Payer: 59 | Attending: Emergency Medicine | Admitting: Emergency Medicine

## 2022-06-03 ENCOUNTER — Emergency Department (HOSPITAL_BASED_OUTPATIENT_CLINIC_OR_DEPARTMENT_OTHER): Payer: 59

## 2022-06-03 ENCOUNTER — Other Ambulatory Visit: Payer: Self-pay

## 2022-06-03 ENCOUNTER — Encounter (HOSPITAL_BASED_OUTPATIENT_CLINIC_OR_DEPARTMENT_OTHER): Payer: Self-pay | Admitting: Obstetrics and Gynecology

## 2022-06-03 DIAGNOSIS — R110 Nausea: Secondary | ICD-10-CM | POA: Insufficient documentation

## 2022-06-03 DIAGNOSIS — I10 Essential (primary) hypertension: Secondary | ICD-10-CM | POA: Diagnosis not present

## 2022-06-03 DIAGNOSIS — R109 Unspecified abdominal pain: Secondary | ICD-10-CM

## 2022-06-03 DIAGNOSIS — R1033 Periumbilical pain: Secondary | ICD-10-CM | POA: Diagnosis not present

## 2022-06-03 DIAGNOSIS — R1032 Left lower quadrant pain: Secondary | ICD-10-CM | POA: Insufficient documentation

## 2022-06-03 DIAGNOSIS — E119 Type 2 diabetes mellitus without complications: Secondary | ICD-10-CM | POA: Diagnosis not present

## 2022-06-03 LAB — URINALYSIS, ROUTINE W REFLEX MICROSCOPIC
Bilirubin Urine: NEGATIVE
Glucose, UA: NEGATIVE mg/dL
Hgb urine dipstick: NEGATIVE
Ketones, ur: 15 mg/dL — AB
Leukocytes,Ua: NEGATIVE
Nitrite: NEGATIVE
Specific Gravity, Urine: 1.022 (ref 1.005–1.030)
pH: 6.5 (ref 5.0–8.0)

## 2022-06-03 LAB — CBC
HCT: 31 % — ABNORMAL LOW (ref 36.0–46.0)
Hemoglobin: 10.2 g/dL — ABNORMAL LOW (ref 12.0–15.0)
MCH: 28.9 pg (ref 26.0–34.0)
MCHC: 32.9 g/dL (ref 30.0–36.0)
MCV: 87.8 fL (ref 80.0–100.0)
Platelets: 191 10*3/uL (ref 150–400)
RBC: 3.53 MIL/uL — ABNORMAL LOW (ref 3.87–5.11)
RDW: 14.8 % (ref 11.5–15.5)
WBC: 3.6 10*3/uL — ABNORMAL LOW (ref 4.0–10.5)
nRBC: 0 % (ref 0.0–0.2)

## 2022-06-03 LAB — COMPREHENSIVE METABOLIC PANEL
ALT: 22 U/L (ref 0–44)
AST: 31 U/L (ref 15–41)
Albumin: 3.3 g/dL — ABNORMAL LOW (ref 3.5–5.0)
Alkaline Phosphatase: 68 U/L (ref 38–126)
Anion gap: 10 (ref 5–15)
BUN: 6 mg/dL (ref 6–20)
CO2: 30 mmol/L (ref 22–32)
Calcium: 8.6 mg/dL — ABNORMAL LOW (ref 8.9–10.3)
Chloride: 100 mmol/L (ref 98–111)
Creatinine, Ser: 0.55 mg/dL (ref 0.44–1.00)
GFR, Estimated: >60 mL/min (ref >60)
Glucose, Bld: 70 mg/dL (ref 70–99)
Potassium: 3.3 mmol/L — ABNORMAL LOW (ref 3.5–5.1)
Sodium: 140 mmol/L (ref 135–145)
Total Bilirubin: 0.9 mg/dL (ref 0.3–1.2)
Total Protein: 6.6 g/dL (ref 6.5–8.1)

## 2022-06-03 LAB — LIPASE, BLOOD: Lipase: <10 U/L — ABNORMAL LOW (ref 11–51)

## 2022-06-03 MED ORDER — LACTATED RINGERS IV BOLUS
1000.0000 mL | Freq: Once | INTRAVENOUS | Status: AC
  Administered 2022-06-03: 1000 mL via INTRAVENOUS

## 2022-06-03 MED ORDER — ALUM & MAG HYDROXIDE-SIMETH 200-200-20 MG/5ML PO SUSP
30.0000 mL | Freq: Once | ORAL | Status: AC
  Administered 2022-06-03: 30 mL via ORAL
  Filled 2022-06-03: qty 30

## 2022-06-03 MED ORDER — IOHEXOL 300 MG/ML  SOLN
100.0000 mL | Freq: Once | INTRAMUSCULAR | Status: AC | PRN
  Administered 2022-06-03: 80 mL via INTRAVENOUS

## 2022-06-03 MED ORDER — ONDANSETRON HCL 4 MG PO TABS: 4.0000 mg | ORAL_TABLET | Freq: Four times a day (QID) | ORAL | 0 refills | Status: AC | PRN

## 2022-06-03 MED ORDER — FAMOTIDINE 20 MG PO TABS
20.0000 mg | ORAL_TABLET | Freq: Once | ORAL | Status: AC
Start: 2022-06-03 — End: 2022-06-03
  Administered 2022-06-03: 20 mg via ORAL
  Filled 2022-06-03: qty 1

## 2022-06-03 MED ORDER — ONDANSETRON HCL 4 MG/2ML IJ SOLN
4.0000 mg | Freq: Once | INTRAMUSCULAR | Status: AC
  Administered 2022-06-03: 4 mg via INTRAVENOUS
  Filled 2022-06-03: qty 2

## 2022-06-03 MED ORDER — POTASSIUM CHLORIDE CRYS ER 20 MEQ PO TBCR
20.0000 meq | EXTENDED_RELEASE_TABLET | Freq: Once | ORAL | Status: AC
  Administered 2022-06-03: 20 meq via ORAL
  Filled 2022-06-03: qty 1

## 2022-06-03 MED ORDER — PANTOPRAZOLE SODIUM 20 MG PO TBEC
20.0000 mg | DELAYED_RELEASE_TABLET | Freq: Once | ORAL | Status: DC
  Filled 2022-06-03: qty 1

## 2022-06-03 MED ORDER — OXYCODONE HCL 5 MG PO TABS
5.0000 mg | ORAL_TABLET | Freq: Once | ORAL | Status: AC
  Administered 2022-06-03: 5 mg via ORAL
  Filled 2022-06-03: qty 1

## 2022-06-03 MED ORDER — MORPHINE SULFATE (PF) 4 MG/ML IV SOLN
4.0000 mg | INTRAVENOUS | Status: DC | PRN
  Administered 2022-06-03: 4 mg via INTRAVENOUS
  Filled 2022-06-03 (×2): qty 1

## 2022-06-03 MED ORDER — PANTOPRAZOLE SODIUM 40 MG PO TBEC
40.0000 mg | DELAYED_RELEASE_TABLET | Freq: Once | ORAL | Status: AC
  Administered 2022-06-03: 40 mg via ORAL
  Filled 2022-06-03: qty 1

## 2022-06-03 NOTE — Discharge Instructions (Addendum)
You were seen today for abdominal pain.  Your work-up is without focal abnormality.  You do have this developing marginal ulcer which is chronic in nature.  The best further evaluation for this would be via endoscopy which you have scheduled for tomorrow.  Please keep that appointment and follow-up closely with the specialist providers at that facility.  Please let your surgeon know about your ongoing symptoms should they wish to arrange close follow-up with you. Your potassium was low and you are given a potassium tablet as well as a balanced electrolyte solution via your IV. We will prescribe Zofran to assist with managing your symptoms tonight and you may take Tylenol.  Please follow-up close with your primary care provider as well. Return if you have worsening of your abdominal pain, intolerance of p.o. intake or any new severe symptoms.  Thank you for the opportunity to participate in your care, Tretha Sciara, MD

## 2022-06-03 NOTE — ED Triage Notes (Signed)
Patient reports to the ER for abdominal pain x4 days. Patient reports she thought it was more gas and has taken tums without relief. Patient reports LLQ pain. Denies hx of diverticulitis. Denies fevers at home. Denies blood in stool. Endorses nausea with 1 episode of emesis.

## 2022-06-03 NOTE — ED Provider Notes (Signed)
[facility] EMERGENCY DEPT Provider Note   CSN: [id number] Arrival date & time: 06/03/22  1008     History Chief Complaint  Patient presents with   Abdominal Pain    HPI [name] Boyd is a 51 y.o. female presenting for abdominal pain. Patient endorses 4 days of left lower quadrant abdominal pain. Patient has a medical history of hypertension, diabetes, gastric bypass surgery, OSA.  Patient has been compliant on her gastric bypass diet and denies any dumping syndrome. Patient states the pain is in progressive nature and gradually worsening.  She has not take any medications prior to arrival other than her scheduled supplements. Patient denies fevers or chills, syncope or shortness of breath.  She endorses nausea and abdominal pain. Patient's recorded medical, surgical, social, medication list and allergies were reviewed in the Snapshot window as part of the initial history.   Review of Systems   Review of Systems  Constitutional:  Negative for chills and fever.  HENT:  Negative for ear pain and sore throat.   Eyes:  Negative for pain and visual disturbance.  Respiratory:  Negative for cough and shortness of breath.   Cardiovascular:  Negative for chest pain and palpitations.  Gastrointestinal:  Positive for abdominal pain, nausea and vomiting.  Genitourinary:  Negative for dysuria and hematuria.  Musculoskeletal:  Negative for arthralgias and back pain.  Skin:  Negative for color change and rash.  Neurological:  Negative for seizures and syncope.  All other systems reviewed and are negative.   Physical Exam Updated Vital Signs BP (!) 157/104   Pulse (!) 49   Temp 98.5 F (36.9 C) (Oral)   Resp 14   Ht '5\' 7"'$  (1.702 m)   Wt 74.8 kg   SpO2 100%   BMI 25.84 kg/m  Physical Exam Vitals and nursing note reviewed.  Constitutional:      General: She is not in acute distress.    Appearance: She is well-developed.  HENT:     Head: Normocephalic and atraumatic.   Eyes:     Conjunctiva/sclera: Conjunctivae normal.  Cardiovascular:     Rate and Rhythm: Normal rate and regular rhythm.     Heart sounds: No murmur heard. Pulmonary:     Effort: Pulmonary effort is normal. No respiratory distress.     Breath sounds: Normal breath sounds.  Abdominal:     General: There is no distension.     Palpations: Abdomen is soft.     Tenderness: There is abdominal tenderness in the periumbilical area, suprapubic area and left lower quadrant. There is guarding. There is no right CVA tenderness or left CVA tenderness.     Hernia: No hernia is present.  Musculoskeletal:        General: No swelling or tenderness. Normal range of motion.     Cervical back: Neck supple.  Skin:    General: Skin is warm and dry.  Neurological:     General: No focal deficit present.     Mental Status: She is alert and oriented to person, place, and time. Mental status is at baseline.     Cranial Nerves: No cranial nerve deficit.      ED Course/ Medical Decision Making/ A&P    Procedures Procedures   Medications Ordered in ED Medications  morphine (PF) 4 MG/ML injection 4 mg (4 mg Intravenous Given 06/03/22 1201)  lactated ringers bolus 1,000 mL (0 mLs Intravenous Stopped 06/03/22 1517)  ondansetron (ZOFRAN) injection 4 mg (4 mg Intravenous Given 06/03/22 1156)  iohexol (OMNIPAQUE) 300 MG/ML solution 100 mL (80 mLs Intravenous Contrast Given 06/03/22 1219)  potassium chloride SA (KLOR-CON M) CR tablet 20 mEq (20 mEq Oral Given 06/03/22 1321)  oxyCODONE (Oxy IR/ROXICODONE) immediate release tablet 5 mg (5 mg Oral Given 06/03/22 1450)  famotidine (PEPCID) tablet 20 mg (20 mg Oral Given 06/03/22 1452)  alum & mag hydroxide-simeth (MAALOX/MYLANTA) 200-200-20 MG/5ML suspension 30 mL (30 mLs Oral Given 06/03/22 1452)  pantoprazole (PROTONIX) EC tablet 40 mg (40 mg Oral Given 06/03/22 1459)    Medical Decision Making:    [name] Volante is a 51 y.o. female who presented to the ED today with  abdominal pain detailed above.     Additional history discussed with patient's family/caregivers.  Patient's presentation is complicated by their history of multiple comorbid medical problems including hypertension, diabetes, gastric bypass.  Patient placed on continuous vitals and telemetry monitoring while in ED which was reviewed periodically.   Complete initial physical exam performed, notably the patient  was hemodynamically stable in no acute distress.  She has a diffusely tender abdomen with focal pain in the left lower quadrant.       Reviewed and confirmed nursing documentation for past medical history, family history, social history.  Notably, patient is status post hysterectomy.  Initial Assessment:    With the patient's presentation of abdominal pain, most likely diagnosis is diverticulitis, appendicitis, cholecystitis, complication of her gastric bypass. Other diagnoses were considered including (but not limited to) nephrolithiasis, ovarian torsion, small bowel obstruction, gastroenteritis, AAA. These are considered less likely due to history of present illness and physical exam findings.   This is most consistent with an acute life/limb threatening illness complicated by underlying chronic conditions.  Initial Plan:  CT abdomen pelvis with contrast to evaluate for structural intra-abdominal pathology Screening labs including CBC and Metabolic panel to evaluate for infectious or metabolic etiology of disease.  Urinalysis with reflex culture ordered to evaluate for UTI or relevant urologic/nephrologic pathology.  Objective evaluation as below reviewed with plan for close reassessment  Initial Study Results:   Laboratory  All laboratory results reviewed without evidence of clinically relevant pathology.   Exceptions include: Mild hypokalemia, chronic anemia at baseline of 10.2, chronic leukocytopenia adiology  All images reviewed independently. Agree with radiology report at  this time.   CT ABDOMEN PELVIS W CONTRAST  Result Date: 06/03/2022 CLINICAL DATA:  Left lower quadrant abdominal pain for the past 4 days. EXAM: CT ABDOMEN AND PELVIS WITH CONTRAST TECHNIQUE: Multidetector CT imaging of the abdomen and pelvis was performed using the standard protocol following bolus administration of intravenous contrast. RADIATION DOSE REDUCTION: This exam was performed according to the departmental dose-optimization program which includes automated exposure control, adjustment of the mA and/or kV according to patient size and/or use of iterative reconstruction technique. CONTRAST:  78m OMNIPAQUE IOHEXOL 300 MG/ML  SOLN COMPARISON:  MRI abdomen dated Mar 22, 2022. CT abdomen pelvis dated February 01, 2022. FINDINGS: Lower chest: No acute abnormality. Hepatobiliary: Unchanged diffusely decreased liver density with prominent focal fat along the falciform ligament inferiorly. Chronic significantly distended gallbladder without wall thickening or gallstones. No biliary dilatation. Pancreas: Unremarkable. No pancreatic ductal dilatation or surrounding inflammatory changes. Spleen: Normal in size without focal abnormality. Adrenals/Urinary Tract: Adrenal glands and right kidney are unremarkable. 1.4 cm lesion in the posterior left kidney previously characterized as a hemorrhagic cyst on prior MRI. No follow-up imaging is recommended. No renal calculi or hydronephrosis. The bladder is unremarkable. Stomach/Bowel: Prior Roux-en-Y gastric bypass surgery.  Chronic prominent wall thickening of the Roux limb just beyond the gastrojejunal anastomosis (series 2, image 18). No obstruction. Colon is unremarkable. Normal appendix. Vascular/Lymphatic: No significant vascular findings are present. No enlarged abdominal or pelvic lymph nodes. Reproductive: 3.6 cm simple appearing cyst in the right ovary, previously 2.8 cm. No follow-up imaging is recommended. Prior hysterectomy. Normal left ovary. Other: New small  ascites in the pelvis. No pneumoperitoneum. Unchanged diastasis of the ventral abdominal wall with small fat containing midline supraumbilical and umbilical hernias. Musculoskeletal: New mild anasarca. No acute or significant osseous findings. IMPRESSION: 1. Prior Roux-en-Y gastric bypass surgery with chronic inflammation of the Roux limb just beyond the gastrojejunal anastomosis. Correlate for underlying marginal ulcer. 2. New small ascites and mild anasarca. 3. Unchanged hepatic steatosis. 4. Chronic gallbladder distension without evidence cholecystitis. Electronically Signed   By: Titus Dubin M.D.   On: 06/03/2022 12:45     Final Assessment and Plan:   On repeat assessment, patient's pain is well controlled.  She is tolerating p.o. intake in no acute distress.  She is able to ambulate and grossly symptomatically improved.  CT findings concerning for continued marginal ulcer.  No CT findings of perforation, no laboratory or physical exam findings of peritonitis. Do not feel that patient's presentation represents SBP given reassuring exam on repeat, lack of leukocytosis.   Patient has endoscopy scheduled for tomorrow a.m. and feels her pain is well controlled at this time.  Recommended close follow-up with endoscopy appointment tomorrow as scheduled as well as ongoing care planning with her outpatient bariatric surgeon.  Patient expressed understanding and is in no acute distress on reassessment stable for continued outpatient care and management.  Disposition:  Based on the above findings, I believe patient is stable for discharge.    Patient/family educated about specific return precautions for given chief complaint and symptoms.  Patient/family educated about follow-up with PCP and GI/MIS.     Patient/family expressed understanding of return precautions and need for follow-up. Patient spoken to regarding all imaging and laboratory results and appropriate follow up for these results. All  education provided in verbal form with additional information in written form. Time was allowed for answering of patient questions. Patient discharged.    Emergency Department Medication Summary:   Medications  morphine (PF) 4 MG/ML injection 4 mg (4 mg Intravenous Given 06/03/22 1201)  lactated ringers bolus 1,000 mL (0 mLs Intravenous Stopped 06/03/22 1517)  ondansetron (ZOFRAN) injection 4 mg (4 mg Intravenous Given 06/03/22 1156)  iohexol (OMNIPAQUE) 300 MG/ML solution 100 mL (80 mLs Intravenous Contrast Given 06/03/22 1219)  potassium chloride SA (KLOR-CON M) CR tablet 20 mEq (20 mEq Oral Given 06/03/22 1321)  oxyCODONE (Oxy IR/ROXICODONE) immediate release tablet 5 mg (5 mg Oral Given 06/03/22 1450)  famotidine (PEPCID) tablet 20 mg (20 mg Oral Given 06/03/22 1452)  alum & mag hydroxide-simeth (MAALOX/MYLANTA) 200-200-20 MG/5ML suspension 30 mL (30 mLs Oral Given 06/03/22 1452)  pantoprazole (PROTONIX) EC tablet 40 mg (40 mg Oral Given 06/03/22 1459)         Clinical Impression:  1. Abdominal pain, unspecified abdominal location         Discharge   Final Clinical Impression(s) / ED Diagnoses Final diagnoses:  Abdominal pain, unspecified abdominal location    Rx / DC Orders ED Discharge Orders          Ordered    ondansetron (ZOFRAN) 4 MG tablet  Every 6 hours PRN        06/03/22 1348  Tretha Sciara, MD 06/03/22 1520

## 2022-06-03 NOTE — ED Notes (Signed)
Patient transported to CT 

## 2022-06-07 ENCOUNTER — Other Ambulatory Visit: Payer: Self-pay

## 2022-06-07 ENCOUNTER — Encounter (HOSPITAL_BASED_OUTPATIENT_CLINIC_OR_DEPARTMENT_OTHER): Payer: Self-pay

## 2022-06-07 DIAGNOSIS — D72819 Decreased white blood cell count, unspecified: Secondary | ICD-10-CM | POA: Insufficient documentation

## 2022-06-07 DIAGNOSIS — R112 Nausea with vomiting, unspecified: Secondary | ICD-10-CM | POA: Insufficient documentation

## 2022-06-07 DIAGNOSIS — R1013 Epigastric pain: Secondary | ICD-10-CM | POA: Diagnosis not present

## 2022-06-07 DIAGNOSIS — R109 Unspecified abdominal pain: Secondary | ICD-10-CM | POA: Diagnosis present

## 2022-06-07 LAB — PREGNANCY, URINE: Preg Test, Ur: NEGATIVE

## 2022-06-07 LAB — COMPREHENSIVE METABOLIC PANEL
ALT: 22 U/L (ref 0–44)
AST: 36 U/L (ref 15–41)
Albumin: 3.1 g/dL — ABNORMAL LOW (ref 3.5–5.0)
Alkaline Phosphatase: 65 U/L (ref 38–126)
Anion gap: 8 (ref 5–15)
BUN: 6 mg/dL (ref 6–20)
CO2: 31 mmol/L (ref 22–32)
Calcium: 8.5 mg/dL — ABNORMAL LOW (ref 8.9–10.3)
Chloride: 99 mmol/L (ref 98–111)
Creatinine, Ser: 0.55 mg/dL (ref 0.44–1.00)
GFR, Estimated: >60 mL/min (ref >60)
Glucose, Bld: 82 mg/dL (ref 70–99)
Potassium: 3.5 mmol/L (ref 3.5–5.1)
Sodium: 138 mmol/L (ref 135–145)
Total Bilirubin: 0.9 mg/dL (ref 0.3–1.2)
Total Protein: 6.8 g/dL (ref 6.5–8.1)

## 2022-06-07 LAB — CBC
HCT: 34.8 % — ABNORMAL LOW (ref 36.0–46.0)
Hemoglobin: 11.4 g/dL — ABNORMAL LOW (ref 12.0–15.0)
MCH: 29 pg (ref 26.0–34.0)
MCHC: 32.8 g/dL (ref 30.0–36.0)
MCV: 88.5 fL (ref 80.0–100.0)
Platelets: 227 10*3/uL (ref 150–400)
RBC: 3.93 MIL/uL (ref 3.87–5.11)
RDW: 14.7 % (ref 11.5–15.5)
WBC: 3.8 10*3/uL — ABNORMAL LOW (ref 4.0–10.5)
nRBC: 0 % (ref 0.0–0.2)

## 2022-06-07 LAB — URINALYSIS, ROUTINE W REFLEX MICROSCOPIC
Glucose, UA: NEGATIVE mg/dL
Hgb urine dipstick: NEGATIVE
Ketones, ur: 15 mg/dL — AB
Leukocytes,Ua: NEGATIVE
Nitrite: NEGATIVE
Protein, ur: NEGATIVE mg/dL
Specific Gravity, Urine: 1.02 (ref 1.005–1.030)
pH: 7.5 (ref 5.0–8.0)

## 2022-06-07 LAB — LIPASE, BLOOD: Lipase: <10 U/L — ABNORMAL LOW (ref 11–51)

## 2022-06-07 MED ORDER — ONDANSETRON 4 MG PO TBDP
4.0000 mg | ORAL_TABLET | Freq: Once | ORAL | Status: DC | PRN
  Filled 2022-06-07: qty 1

## 2022-06-07 NOTE — ED Triage Notes (Signed)
Patient here POV from Home.  Endorses ABD Pain for approximately 2 Weeks. Seen for Same approximately 4 Days ago. Also endorses GERD-Like Symptoms as Patient Throat Burns.   Associated with N/V. Unchanged since visit and despite Interventions.   NAD Noted during Triage. A&Ox4. GCS 15. Ambulatory.

## 2022-06-08 ENCOUNTER — Emergency Department (HOSPITAL_BASED_OUTPATIENT_CLINIC_OR_DEPARTMENT_OTHER)
Admission: EM | Admit: 2022-06-08 | Discharge: 2022-06-08 | Disposition: A | Discharge status: Home / Self Care | Payer: 59 | Attending: Emergency Medicine | Admitting: Emergency Medicine

## 2022-06-08 DIAGNOSIS — R1013 Epigastric pain: Secondary | ICD-10-CM

## 2022-06-08 DIAGNOSIS — R112 Nausea with vomiting, unspecified: Secondary | ICD-10-CM

## 2022-06-08 MED ORDER — OXYCODONE-ACETAMINOPHEN 5-325 MG PO TABS: 1.0000 | ORAL_TABLET | Freq: Four times a day (QID) | ORAL | 0 refills | Status: AC | PRN

## 2022-06-08 MED ORDER — OXYCODONE-ACETAMINOPHEN 5-325 MG PO TABS
1.0000 | ORAL_TABLET | Freq: Once | ORAL | Status: AC
  Administered 2022-06-08: 1 via ORAL
  Filled 2022-06-08: qty 1

## 2022-06-08 NOTE — ED Notes (Signed)
ED Provider at bedside. 

## 2022-06-08 NOTE — ED Notes (Signed)
Pt agreeable with d/c plan as discussed by provider- this nurse has verbally reinforced d/c instructions and provided pt with written copy - pt acknowledges verbal understanding and denies any additional questions, concerns, needs - pt reports will be using [facility] for transport home; confirms she will not be driving.

## 2022-06-08 NOTE — ED Provider Notes (Signed)
[facility]  Provider Note  CSN: [id number] Arrival date & time: 06/07/22 1900  History Chief Complaint  Patient presents with   Abdominal Pain    [name] Boyd is a 51 y.o. female with history of Roux-en-Y gastric bypass reports 2 weeks of persistent abdominal pain and vomiting. 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 She has not had any improvement in her symptoms. Denies EtOH or THC use.    Home Medications Prior to Admission medications   Medication Sig Start Date End Date Taking? Authorizing Provider  oxyCODONE-acetaminophen (PERCOCET/ROXICET) 5-325 MG tablet Take 1 tablet by mouth every 6 (six) hours as needed for severe pain. 06/08/22  Yes Truddie Hidden, MD  ondansetron (ZOFRAN) 4 MG tablet Take 1 tablet (4 mg total) by mouth every 6 (six) hours as needed for nausea or vomiting. 06/03/22   Tretha Sciara, MD  ondansetron (ZOFRAN-ODT) 4 MG disintegrating tablet Take 4 mg by mouth every 8 (eight) hours. 10/03/21   [provider]  pantoprazole (PROTONIX) 40 MG tablet Take 1 tablet (40 mg total) by mouth daily. 02/04/22 03/06/22  Antonieta Pert, MD  potassium chloride (KLOR-CON M) 10 MEQ tablet Take 1 tablet (10 mEq total) by mouth daily for 14 days. 02/04/22 02/18/22  Antonieta Pert, MD  sucralfate (CARAFATE) 1 g tablet Take 1 g by mouth 4 (four) times daily. Dissolve in water 12/04/21   [provider]     Allergies    Patient has no known allergies.   Review of Systems   Review of Systems Please see HPI for pertinent positives and negatives  Physical Exam BP (!) 161/90 (BP Location: Right Arm)   Pulse 66   Temp 98.3 F (36.8 C)   Resp 18   Ht '5\' 7"'$  (1.702 m)   Wt 74.8 kg   SpO2 100%   BMI 25.83 kg/m   Physical Exam Vitals and nursing note reviewed.  Constitutional:      Appearance: Normal appearance.  HENT:      Head: Normocephalic and atraumatic.     Nose: Nose normal.     Mouth/Throat:     Mouth: Mucous membranes are moist.  Eyes:     Extraocular Movements: Extraocular movements intact.     Conjunctiva/sclera: Conjunctivae normal.  Cardiovascular:     Rate and Rhythm: Normal rate.  Pulmonary:     Effort: Pulmonary effort is normal.     Breath sounds: Normal breath sounds.  Abdominal:     General: Abdomen is flat.     Palpations: Abdomen is soft.     Tenderness: There is abdominal tenderness in the epigastric area. There is no guarding. Negative signs include Murphy's sign and McBurney's sign.  Musculoskeletal:        General: No swelling. Normal range of motion.     Cervical back: Neck supple.  Skin:    General: Skin is warm and dry.  Neurological:     General: No focal deficit present.     Mental Status: She is alert.  Psychiatric:        Mood and Affect: Mood normal.     ED Results / Procedures / Treatments   EKG None  Procedures Procedures  Medications Ordered in the ED Medications  ondansetron (ZOFRAN-ODT) disintegrating tablet 4 mg (has no administration in time range)  oxyCODONE-acetaminophen (PERCOCET/ROXICET) 5-325 MG per tablet 1  tablet (has no administration in time range)    Initial Impression and Plan  Patient here with continued abdominal pain and vomiting, last threw up prior to arrival several hours ago. Labs done in triage showed mild leukopenia, similar to previous. CMP and lipase are neg. UA is unremarkable. Abdomen is benign. No concern for acute surgical process. Will give a dose of pain medications and a short term Rx for oxycodone. Recommend outpatient follow up for long term management.   ED Course       MDM Rules/Calculators/A&P Medical Decision Making Problems Addressed: Epigastric pain: chronic illness or injury with exacerbation, progression, or side effects of treatment Nausea and vomiting, unspecified vomiting type: chronic illness or  injury  Amount and/or Complexity of Data Reviewed External Data Reviewed: notes.    Details: outpatient EGD Labs: ordered. Decision-making details documented in ED Course.  Risk Prescription drug management.    Final Clinical Impression(s) / ED Diagnoses Final diagnoses:  Epigastric pain  Nausea and vomiting, unspecified vomiting type    Rx / DC Orders ED Discharge Orders          Ordered    oxyCODONE-acetaminophen (PERCOCET/ROXICET) 5-325 MG tablet  Every 6 hours PRN        06/08/22 0111             Truddie Hidden, MD 06/08/22 [id number]

## 2022-06-10 ENCOUNTER — Emergency Department (HOSPITAL_BASED_OUTPATIENT_CLINIC_OR_DEPARTMENT_OTHER)
Admission: EM | Admit: 2022-06-10 | Discharge: 2022-06-10 | Disposition: A | Discharge status: Home / Self Care | Payer: 59 | Attending: Emergency Medicine | Admitting: Emergency Medicine

## 2022-06-10 ENCOUNTER — Encounter (HOSPITAL_BASED_OUTPATIENT_CLINIC_OR_DEPARTMENT_OTHER): Payer: Self-pay | Admitting: Emergency Medicine

## 2022-06-10 ENCOUNTER — Other Ambulatory Visit: Payer: Self-pay

## 2022-06-10 DIAGNOSIS — E871 Hypo-osmolality and hyponatremia: Secondary | ICD-10-CM | POA: Insufficient documentation

## 2022-06-10 DIAGNOSIS — E878 Other disorders of electrolyte and fluid balance, not elsewhere classified: Secondary | ICD-10-CM | POA: Insufficient documentation

## 2022-06-10 DIAGNOSIS — R109 Unspecified abdominal pain: Secondary | ICD-10-CM | POA: Diagnosis not present

## 2022-06-10 DIAGNOSIS — R112 Nausea with vomiting, unspecified: Secondary | ICD-10-CM | POA: Diagnosis present

## 2022-06-10 LAB — COMPREHENSIVE METABOLIC PANEL
ALT: 13 U/L (ref 0–44)
AST: 20 U/L (ref 15–41)
Albumin: 3 g/dL — ABNORMAL LOW (ref 3.5–5.0)
Alkaline Phosphatase: 59 U/L (ref 38–126)
Anion gap: 14 (ref 5–15)
BUN: 11 mg/dL (ref 6–20)
CO2: 26 mmol/L (ref 22–32)
Calcium: 8.8 mg/dL — ABNORMAL LOW (ref 8.9–10.3)
Chloride: 94 mmol/L — ABNORMAL LOW (ref 98–111)
Creatinine, Ser: 0.63 mg/dL (ref 0.44–1.00)
GFR, Estimated: >60 mL/min (ref >60)
Glucose, Bld: 56 mg/dL — ABNORMAL LOW (ref 70–99)
Potassium: 4.4 mmol/L (ref 3.5–5.1)
Sodium: 134 mmol/L — ABNORMAL LOW (ref 135–145)
Total Bilirubin: 0.8 mg/dL (ref 0.3–1.2)
Total Protein: 6.5 g/dL (ref 6.5–8.1)

## 2022-06-10 LAB — CBC WITH DIFFERENTIAL/PLATELET
Abs Immature Granulocytes: 0.01 10*3/uL (ref 0.00–0.07)
Basophils Absolute: 0 10*3/uL (ref 0.0–0.1)
Basophils Relative: 0 %
Eosinophils Absolute: 0 10*3/uL (ref 0.0–0.5)
Eosinophils Relative: 0 %
HCT: 34.6 % — ABNORMAL LOW (ref 36.0–46.0)
Hemoglobin: 11.4 g/dL — ABNORMAL LOW (ref 12.0–15.0)
Immature Granulocytes: 0 %
Lymphocytes Relative: 21 %
Lymphs Abs: 1 10*3/uL (ref 0.7–4.0)
MCH: 28.9 pg (ref 26.0–34.0)
MCHC: 32.9 g/dL (ref 30.0–36.0)
MCV: 87.8 fL (ref 80.0–100.0)
Monocytes Absolute: 0.3 10*3/uL (ref 0.1–1.0)
Monocytes Relative: 7 %
Neutro Abs: 3.4 10*3/uL (ref 1.7–7.7)
Neutrophils Relative %: 72 %
Platelets: 208 10*3/uL (ref 150–400)
RBC: 3.94 MIL/uL (ref 3.87–5.11)
RDW: 14.1 % (ref 11.5–15.5)
WBC: 4.7 10*3/uL (ref 4.0–10.5)
nRBC: 0 % (ref 0.0–0.2)

## 2022-06-10 LAB — LIPASE, BLOOD: Lipase: <10 U/L — ABNORMAL LOW (ref 11–51)

## 2022-06-10 MED ORDER — SODIUM CHLORIDE 0.9 % IV BOLUS
1000.0000 mL | Freq: Once | INTRAVENOUS | Status: AC
  Administered 2022-06-10: 1000 mL via INTRAVENOUS

## 2022-06-10 MED ORDER — PROMETHAZINE HCL 25 MG PO TABS: 25.0000 mg | ORAL_TABLET | Freq: Four times a day (QID) | ORAL | 0 refills | Status: AC | PRN

## 2022-06-10 MED ORDER — MORPHINE SULFATE (PF) 4 MG/ML IV SOLN
4.0000 mg | Freq: Once | INTRAVENOUS | Status: AC
  Administered 2022-06-10: 4 mg via INTRAVENOUS
  Filled 2022-06-10: qty 1

## 2022-06-10 MED ORDER — DROPERIDOL 2.5 MG/ML IJ SOLN
1.2500 mg | Freq: Once | INTRAMUSCULAR | Status: AC
  Administered 2022-06-10: 1.25 mg via INTRAVENOUS
  Filled 2022-06-10: qty 2

## 2022-06-10 MED ORDER — PROMETHAZINE HCL 25 MG RE SUPP: 25.0000 mg | Freq: Four times a day (QID) | RECTAL | 0 refills | Status: AC | PRN

## 2022-06-10 NOTE — Discharge Instructions (Signed)
Please follow-up with your gastroenterologist in the office.  I prescribed you 2 different nausea medicines to try and help you at home.  Start with the Zofran if that does not work if you are able to swallow without vomiting you can try the oral Phenergan and if not then you can try the suppository.  Please return for worsening pain fever or inability eat or drink.  If you are having trouble with your protonix you could try pepcid or tagamet up to twice a day.  Try to avoid things that may make this worse, most commonly these are spicy foods tomato based products fatty foods chocolate and peppermint.  Alcohol and tobacco can also make this worse.  Return to the emergency department for sudden worsening pain fever or inability to eat or drink.

## 2022-06-10 NOTE — ED Provider Notes (Signed)
[facility] DE[facility] Provider Note   CSN: [id number] Arrival date & time: 06/10/22  1544     History  Chief Complaint  Patient presents with   Abdominal Pain    [name] Boyd is a 51 y.o. female.  51 yo F with a chief complaint of intractable nausea and vomiting.  This been an ongoing problem for her.  She had a bypass procedure was done in October of last year.  Since that is struggled with eating and drinking well.  Has had recurrent gastric ulcers and is on Carafate and Maalox at home.  She has been struggling with that as had trouble keeping anything down over the past week or so.  Had a recent endoscopy that reportedly showed ulcers but had no other finding.  She has some transient improvement with her medicines but feels like they are not working well.  No fevers no new pain.  Is able to tolerate things off and on.   Abdominal Pain      Home Medications Prior to Admission medications   Medication Sig Start Date End Date Taking? Authorizing Provider  promethazine (PHENERGAN) 25 MG suppository Place 1 suppository (25 mg total) rectally every 6 (six) hours as needed for nausea or vomiting. 06/10/22  Yes Deno Etienne, DO  promethazine (PHENERGAN) 25 MG tablet Take 1 tablet (25 mg total) by mouth every 6 (six) hours as needed for nausea or vomiting. 06/10/22  Yes Deno Etienne, DO  ondansetron (ZOFRAN) 4 MG tablet Take 1 tablet (4 mg total) by mouth every 6 (six) hours as needed for nausea or vomiting. 06/03/22   Tretha Sciara, MD  ondansetron (ZOFRAN-ODT) 4 MG disintegrating tablet Take 4 mg by mouth every 8 (eight) hours. 10/03/21   [provider]  oxyCODONE-acetaminophen (PERCOCET/ROXICET) 5-325 MG tablet Take 1 tablet by mouth every 6 (six) hours as needed for severe pain. 06/08/22   Truddie Hidden, MD  pantoprazole (PROTONIX) 40 MG tablet Take 1 tablet (40 mg total) by mouth daily. 02/04/22 03/06/22  Antonieta Pert, MD  potassium chloride (KLOR-CON M) 10  MEQ tablet Take 1 tablet (10 mEq total) by mouth daily for 14 days. 02/04/22 02/18/22  Antonieta Pert, MD  sucralfate (CARAFATE) 1 g tablet Take 1 g by mouth 4 (four) times daily. Dissolve in water 12/04/21   [provider]      Allergies    Patient has no known allergies.    Review of Systems   Review of Systems  Gastrointestinal:  Positive for abdominal pain.    Physical Exam Updated Vital Signs BP (!) 155/96   Pulse (!) 59   Temp 98.6 F (37 C) (Oral)   Resp 11   Ht '5\' 7"'$  (1.702 m)   Wt 74.8 kg   SpO2 100%   BMI 25.83 kg/m  Physical Exam Vitals and nursing note reviewed.  Constitutional:      General: She is not in acute distress.    Appearance: She is well-developed. She is not diaphoretic.  HENT:     Head: Normocephalic and atraumatic.  Eyes:     Pupils: Pupils are equal, round, and reactive to light.  Cardiovascular:     Rate and Rhythm: Normal rate and regular rhythm.     Heart sounds: No murmur heard.    No friction rub. No gallop.  Pulmonary:     Effort: Pulmonary effort is normal.     Breath sounds: No wheezing or rales.  Abdominal:     General:  There is no distension.     Palpations: Abdomen is soft.     Tenderness: There is no abdominal tenderness.     Comments: Benign abdominal exam  Musculoskeletal:        General: No tenderness.     Cervical back: Normal range of motion and neck supple.  Skin:    General: Skin is warm and dry.  Neurological:     Mental Status: She is alert and oriented to person, place, and time.  Psychiatric:        Behavior: Behavior normal.     ED Results / Procedures / Treatments   Labs (all labs ordered are listed, but only abnormal results are displayed) Labs Reviewed  CBC WITH DIFFERENTIAL/PLATELET - Abnormal; Notable for the following components:      Result Value   Hemoglobin 11.4 (*)    HCT 34.6 (*)    All other components within normal limits  COMPREHENSIVE METABOLIC PANEL - Abnormal; Notable for the  following components:   Sodium 134 (*)    Chloride 94 (*)    Glucose, Bld 56 (*)    Calcium 8.8 (*)    Albumin 3.0 (*)    All other components within normal limits  LIPASE, BLOOD - Abnormal; Notable for the following components:   Lipase <10 (*)    All other components within normal limits    EKG None  Radiology No results found.  Procedures Procedures    Medications Ordered in ED Medications  sodium chloride 0.9 % bolus 1,000 mL (1,000 mLs Intravenous New Bag/Given 06/10/22 1638)  droperidol (INAPSINE) 2.5 MG/ML injection 1.25 mg (1.25 mg Intravenous Given 06/10/22 1703)  morphine (PF) 4 MG/ML injection 4 mg (4 mg Intravenous Given 06/10/22 1704)    ED Course/ Medical Decision Making/ A&P                           Medical Decision Making Amount and/or Complexity of Data Reviewed Labs: ordered.  Risk Prescription drug management.   51 yo F with a chief complaints of intractable nausea and vomiting.  This has been an ongoing problem for her.  Had gastric bypass done about 10 months ago.  Has had recurrent gastric ulcers.  This is now the patient's third visit to the ER in a week.  Had CT imaging at the onset that was negative.  She has a benign abdominal exam for me.  Will obtain labs, fluids, antiemetics, reassess.   Patient with very mild hyponatremia hypochloremia.  Potassium is normal.  Renal function appears to be at baseline.  No significant anemia.  LFTs and lipase unremarkable.  We will discharge the patient home.  PCP and GI follow-up.  5:33 PM:  I have discussed the diagnosis/risks/treatment options with the patient and family.  Evaluation and diagnostic testing in the emergency department does not suggest an emergent condition requiring admission or immediate intervention beyond what has been performed at this time.  They will follow up with  PCP, GI. We also discussed returning to the ED immediately if new or worsening sx occur. We discussed the sx which are most  concerning (e.g., sudden worsening pain, fever, inability to tolerate by mouth) that necessitate immediate return. Medications administered to the patient during their visit and any new prescriptions provided to the patient are listed below.  Medications given during this visit Medications  sodium chloride 0.9 % bolus 1,000 mL (1,000 mLs Intravenous New Bag/Given 06/10/22 1638)  droperidol (INAPSINE) 2.5 MG/ML injection 1.25 mg (1.25 mg Intravenous Given 06/10/22 1703)  morphine (PF) 4 MG/ML injection 4 mg (4 mg Intravenous Given 06/10/22 1704)     The patient appears reasonably screen and/or stabilized for discharge and I doubt any other medical condition or other [facility] requiring further screening, evaluation, or treatment in the ED at this time prior to discharge.          Final Clinical Impression(s) / ED Diagnoses Final diagnoses:  Nausea and vomiting in adult    Rx / DC Orders ED Discharge Orders          Ordered    promethazine (PHENERGAN) 25 MG suppository  Every 6 hours PRN        06/10/22 1730    promethazine (PHENERGAN) 25 MG tablet  Every 6 hours PRN        06/10/22 Gideon, Riann Oman, DO 06/10/22 1733

## 2022-06-10 NOTE — ED Triage Notes (Signed)
Pt endorses abd pain since the 8th. States she was seen here Saturday and unable to keep anything down since. Endorses constat n/v.

## 2022-06-18 ENCOUNTER — Encounter (HOSPITAL_BASED_OUTPATIENT_CLINIC_OR_DEPARTMENT_OTHER): Payer: Self-pay | Admitting: Obstetrics and Gynecology

## 2022-06-18 ENCOUNTER — Other Ambulatory Visit: Payer: Self-pay

## 2022-06-18 ENCOUNTER — Emergency Department (HOSPITAL_BASED_OUTPATIENT_CLINIC_OR_DEPARTMENT_OTHER): Payer: 59 | Admitting: Radiology

## 2022-06-18 ENCOUNTER — Emergency Department (HOSPITAL_BASED_OUTPATIENT_CLINIC_OR_DEPARTMENT_OTHER)
Admission: EM | Admit: 2022-06-18 | Discharge: 2022-06-18 | Disposition: A | Discharge status: Home / Self Care | Payer: 59 | Attending: Emergency Medicine | Admitting: Emergency Medicine

## 2022-06-18 DIAGNOSIS — J45909 Unspecified asthma, uncomplicated: Secondary | ICD-10-CM | POA: Insufficient documentation

## 2022-06-18 DIAGNOSIS — I10 Essential (primary) hypertension: Secondary | ICD-10-CM | POA: Diagnosis not present

## 2022-06-18 DIAGNOSIS — Z79899 Other long term (current) drug therapy: Secondary | ICD-10-CM | POA: Insufficient documentation

## 2022-06-18 DIAGNOSIS — E1165 Type 2 diabetes mellitus with hyperglycemia: Secondary | ICD-10-CM | POA: Insufficient documentation

## 2022-06-18 DIAGNOSIS — R112 Nausea with vomiting, unspecified: Secondary | ICD-10-CM | POA: Diagnosis not present

## 2022-06-18 DIAGNOSIS — R1013 Epigastric pain: Secondary | ICD-10-CM | POA: Diagnosis not present

## 2022-06-18 DIAGNOSIS — R000 Tachycardia, unspecified: Secondary | ICD-10-CM | POA: Diagnosis not present

## 2022-06-18 DIAGNOSIS — R1010 Upper abdominal pain, unspecified: Secondary | ICD-10-CM | POA: Diagnosis present

## 2022-06-18 LAB — CBC
HCT: 35 % — ABNORMAL LOW (ref 36.0–46.0)
Hemoglobin: 11.7 g/dL — ABNORMAL LOW (ref 12.0–15.0)
MCH: 29.2 pg (ref 26.0–34.0)
MCHC: 33.4 g/dL (ref 30.0–36.0)
MCV: 87.3 fL (ref 80.0–100.0)
Platelets: 388 10*3/uL (ref 150–400)
RBC: 4.01 MIL/uL (ref 3.87–5.11)
RDW: 13.7 % (ref 11.5–15.5)
WBC: 3.6 10*3/uL — ABNORMAL LOW (ref 4.0–10.5)
nRBC: 0 % (ref 0.0–0.2)

## 2022-06-18 LAB — LIPASE, BLOOD: Lipase: <10 U/L — ABNORMAL LOW (ref 11–51)

## 2022-06-18 LAB — MAGNESIUM: Magnesium: 1.9 mg/dL (ref 1.7–2.4)

## 2022-06-18 LAB — COMPREHENSIVE METABOLIC PANEL
ALT: 13 U/L (ref 0–44)
AST: 18 U/L (ref 15–41)
Albumin: 3.5 g/dL (ref 3.5–5.0)
Alkaline Phosphatase: 58 U/L (ref 38–126)
Anion gap: 15 (ref 5–15)
BUN: 12 mg/dL (ref 6–20)
CO2: 27 mmol/L (ref 22–32)
Calcium: 9.2 mg/dL (ref 8.9–10.3)
Chloride: 94 mmol/L — ABNORMAL LOW (ref 98–111)
Creatinine, Ser: 0.62 mg/dL (ref 0.44–1.00)
GFR, Estimated: >60 mL/min (ref >60)
Glucose, Bld: 116 mg/dL — ABNORMAL HIGH (ref 70–99)
Potassium: 3.7 mmol/L (ref 3.5–5.1)
Sodium: 136 mmol/L (ref 135–145)
Total Bilirubin: 0.7 mg/dL (ref 0.3–1.2)
Total Protein: 7.4 g/dL (ref 6.5–8.1)

## 2022-06-18 LAB — CBG MONITORING, ED: Glucose-Capillary: 126 mg/dL — ABNORMAL HIGH (ref 70–99)

## 2022-06-18 MED ORDER — METOCLOPRAMIDE HCL 5 MG/ML IJ SOLN
10.0000 mg | Freq: Once | INTRAMUSCULAR | Status: AC
  Administered 2022-06-18: 10 mg via INTRAVENOUS
  Filled 2022-06-18: qty 2

## 2022-06-18 MED ORDER — METOCLOPRAMIDE HCL 10 MG PO TABS: 10.0000 mg | ORAL_TABLET | Freq: Four times a day (QID) | ORAL | 0 refills | Status: AC

## 2022-06-18 MED ORDER — PROCHLORPERAZINE EDISYLATE 10 MG/2ML IJ SOLN
5.0000 mg | Freq: Once | INTRAMUSCULAR | Status: AC
  Administered 2022-06-18: 5 mg via INTRAVENOUS
  Filled 2022-06-18: qty 2

## 2022-06-18 MED ORDER — SODIUM CHLORIDE 0.9 % IV BOLUS
1000.0000 mL | Freq: Once | INTRAVENOUS | Status: AC
  Administered 2022-06-18: 1000 mL via INTRAVENOUS

## 2022-06-18 NOTE — ED Provider Notes (Signed)
[facility] Provider Note   CSN: [id number] Arrival date & time: 06/18/22  0744     History  Chief Complaint  Patient presents with   Abdominal Pain    [name] Boyd is a 51 y.o. female.   Abdominal Pain Patient presents with abdominal pain.  Nausea vomiting.  Worse for last 3 days.  Had been seen in the ER recently for similar symptoms.  Had also been seen by her gastroenterologist and reportedly had a stricture dilated on the eighth.  Seen since then and now she is having more pain.  Has been given Phenergan at home but states has not really been helping.  Decreased appetite.  Pain is in upper abdomen.  Typical pain but more severe.    Past Medical History:  Diagnosis Date   Allergy    seasonal allergies   Asthma    uses inhaler PRN   Diabetes mellitus without complication ([place])    on meds   GERD (gastroesophageal reflux disease)    with certain foods-diet controlled   Hyperlipidemia    on meds   Hypertension    on meds   Thyroid disease    on meds   Vitamin D deficiency    on meds    Home Medications Prior to Admission medications   Medication Sig Start Date End Date Taking? Authorizing Provider  metoCLOPramide (REGLAN) 10 MG tablet Take 1 tablet (10 mg total) by mouth every 6 (six) hours. 06/18/22  Yes Davonna Belling, MD  magnesium oxide (MAG-OX) 400 (240 Mg) MG tablet Take 2 tablets by mouth in the morning and at bedtime. 05/23/22   [provider]  ondansetron (ZOFRAN) 4 MG tablet Take 1 tablet (4 mg total) by mouth every 6 (six) hours as needed for nausea or vomiting. 06/03/22   Tretha Sciara, MD  ondansetron (ZOFRAN-ODT) 4 MG disintegrating tablet Take 4 mg by mouth every 8 (eight) hours. 10/03/21   [provider]  oxyCODONE-acetaminophen (PERCOCET/ROXICET) 5-325 MG tablet Take 1 tablet by mouth every 6 (six) hours as needed for severe pain. 06/08/22   Truddie Hidden, MD  pantoprazole (PROTONIX) 40 MG tablet  Take 1 tablet (40 mg total) by mouth daily. 02/04/22 03/06/22  Antonieta Pert, MD  potassium chloride (KLOR-CON M) 10 MEQ tablet Take 1 tablet (10 mEq total) by mouth daily for 14 days. 02/04/22 02/18/22  Antonieta Pert, MD  promethazine (PHENERGAN) 25 MG suppository Place 1 suppository (25 mg total) rectally every 6 (six) hours as needed for nausea or vomiting. 06/10/22   Deno Etienne, DO  promethazine (PHENERGAN) 25 MG tablet Take 1 tablet (25 mg total) by mouth every 6 (six) hours as needed for nausea or vomiting. 06/10/22   Deno Etienne, DO  spironolactone (ALDACTONE) 25 MG tablet Take 25 mg by mouth daily. 05/23/22   [provider]  sucralfate (CARAFATE) 1 g tablet Take 1 g by mouth 4 (four) times daily. Dissolve in water 12/04/21   [provider]      Allergies    Patient has no known allergies.    Review of Systems   Review of Systems  Gastrointestinal:  Positive for abdominal pain.    Physical Exam Updated Vital Signs BP (!) 134/97   Pulse 79   Temp 98.3 F (36.8 C) (Oral)   Resp 16   SpO2 100%  Physical Exam Vitals and nursing note reviewed.  Cardiovascular:     Rate and Rhythm: Regular rhythm. Tachycardia present.  Abdominal:  Comments: Mild upper abdominal tenderness without rebound or guarding.  No hernia palpated.  No distention.  Skin:    General: Skin is warm.     Capillary Refill: Capillary refill takes less than 2 seconds.  Neurological:     Mental Status: She is alert.     ED Results / Procedures / Treatments   Labs (all labs ordered are listed, but only abnormal results are displayed) Labs Reviewed  LIPASE, BLOOD - Abnormal; Notable for the following components:      Result Value   Lipase <10 (*)    All other components within normal limits  COMPREHENSIVE METABOLIC PANEL - Abnormal; Notable for the following components:   Chloride 94 (*)    Glucose, Bld 116 (*)    All other components within normal limits  CBC - Abnormal; Notable for the  following components:   WBC 3.6 (*)    Hemoglobin 11.7 (*)    HCT 35.0 (*)    All other components within normal limits  CBG MONITORING, ED - Abnormal; Notable for the following components:   Glucose-Capillary 126 (*)    All other components within normal limits  MAGNESIUM    EKG None  Radiology DG Abd 2 Views  Result Date: 06/18/2022 CLINICAL DATA:  Post gastric bypass surgery October 2022. Nausea, vomiting, and abdominal pain intermittently since November 2022. History of ulcers EXAM: ABDOMEN - 2 VIEW COMPARISON:  None FINDINGS: Lung bases clear. Staple lines at stomach and at small bowel loops in LEFT mid abdomen. Nonobstructive bowel gas pattern. No bowel dilatation, bowel wall thickening, or free air. Osseous structures unremarkable. Suspected artifact projecting over LEFT kidney. IMPRESSION: No abnormalities identified. Electronically Signed   By: Lavonia Dana M.D.   On: 06/18/2022 12:17    Procedures Procedures    Medications Ordered in ED Medications  sodium chloride 0.9 % bolus 1,000 mL (0 mLs Intravenous Stopped 06/18/22 1330)  prochlorperazine (COMPAZINE) injection 5 mg (5 mg Intravenous Given 06/18/22 1213)  sodium chloride 0.9 % bolus 1,000 mL (0 mLs Intravenous Stopped 06/18/22 1550)  metoCLOPramide (REGLAN) injection 10 mg (10 mg Intravenous Given 06/18/22 1442)    ED Course/ Medical Decision Making/ A&P                           Medical Decision Making Amount and/or Complexity of Data Reviewed Labs: ordered. Radiology: ordered.  Risk Prescription drug management.   Patient with abdominal pain.  Somewhat acute on chronic.  Nausea and vomiting decreased oral intake.  States whenever she eats it will come back up.  Difficulty tolerating orals.  We will get basic blood work and x-ray.  Will give fluids.  Will reevaluate. Differential diagnosis includes continued stricture.  Obstruction also considered.  Also dehydration.  Lab work actually rather reassuring.   Electrolytes better than they have been in the past.  X-ray done and does not show obstruction.  With normal white count I feel things such as perforation are less likely.  Has tolerated some orals and feels somewhat better after the second liter of fluid.  I think it is reasonable for outpatient follow-up.  I think she is somewhat high risk to have a recurrent episode however.  Will discharge home to follow-up with her PCP and potentially gastroenterologist.  Discussed with patient and family member. I reviewed outpatient GI note.        Final Clinical Impression(s) / ED Diagnoses Final diagnoses:  Nausea and vomiting,  unspecified vomiting type  Epigastric pain    Rx / DC Orders ED Discharge Orders          Ordered    metoCLOPramide (REGLAN) 10 MG tablet  Every 6 hours        06/18/22 1552              Davonna Belling, MD 06/18/22 1553

## 2022-06-18 NOTE — ED Notes (Signed)
Given water and crackers for PO challenge

## 2022-06-18 NOTE — Discharge Instructions (Signed)
Your lab work and x-ray was reassuring today.  Follow-up with your doctor and potentially gastroenterology.  We will try some Reglan instead of Phenergan for now.

## 2022-06-18 NOTE — ED Triage Notes (Signed)
Patient reports to the ER for N/V and abdominal pain. Patient reports for the past three days she has been having constant pain. Patient reports she was here recently and she was given phenergan suppositories which have not resolved the issue

## 2022-06-18 NOTE — ED Notes (Signed)
States became nausea after eating crackers

## 2023-03-17 ENCOUNTER — Other Ambulatory Visit: Payer: Self-pay | Admitting: Urology

## 2023-03-17 DIAGNOSIS — N281 Cyst of kidney, acquired: Secondary | ICD-10-CM

## 2023-04-16 ENCOUNTER — Ambulatory Visit
Admission: RE | Admit: 2023-04-16 | Discharge: 2023-04-16 | Disposition: A | Discharge status: Home / Self Care | Payer: 59 | Source: Ambulatory Visit | Attending: Urology | Admitting: Urology

## 2023-04-16 ENCOUNTER — Other Ambulatory Visit: Payer: 59

## 2023-04-16 DIAGNOSIS — N281 Cyst of kidney, acquired: Secondary | ICD-10-CM

## 2023-08-14 IMAGING — MR MR ABDOMEN WO/W CM
17 series · 48 of 48 positions shown · IV contrast (multihance)
Comparison: Renal ultrasound 02/03/2022

CLINICAL DATA: Follow-up kidney cysts

EXAM:
MRI ABDOMEN WITHOUT AND WITH CONTRAST
TECHNIQUE: Multiplanar multisequence MR imaging of the abdomen was performed
both before and after the administration of intravenous contrast.
CONTRAST:  18mL MULTIHANCE GADOBENATE DIMEGLUMINE 529 MG/ML IV SOLN

[Series 3: T2 · coronal · 5.0mm · 1.56mm/px · 1 of 36 slices shown (1 of 3)]
[im 1/36]
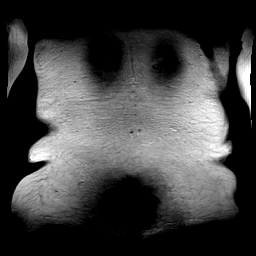

[Series 4: T1 · axial · 3.0mm · 1.19mm/px · z∈[-60,+201]mm · 5 of 176 slices shown]
[im 1/176]
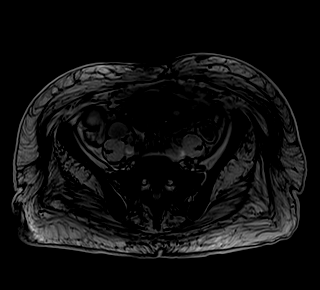
[im 44/176]
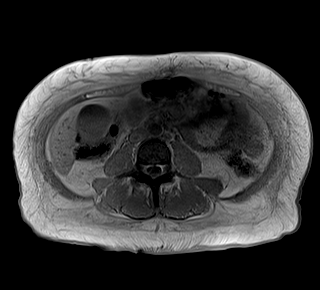
[im 88/176]
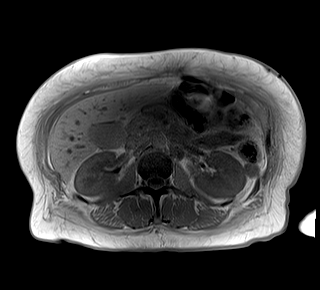
[im 132/176]
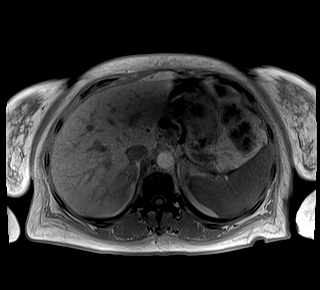
[im 176/176]
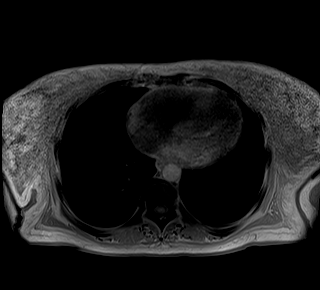

[Series 5: bSSFP · axial · 5.0mm · 1.25mm/px · z∈[-64,+200]mm · 2 of 45 slices shown]
[im 1/45]
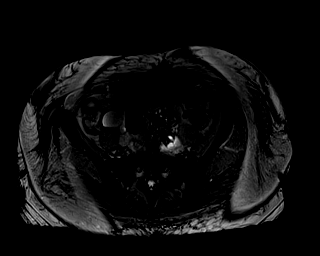
[im 45/45]
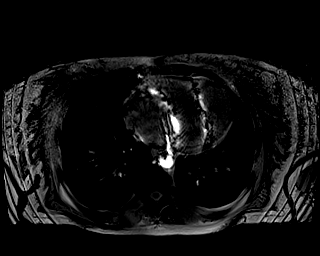

[Series 6: T2 · axial · 6.0mm · 1.19mm/px · 1 of 35 slices shown (2 of 3)]
[im 1/35]
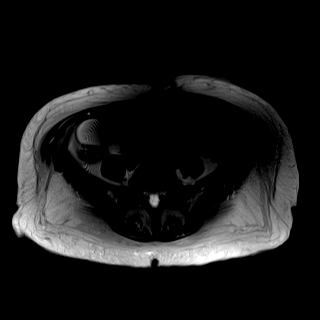

[Series 7: T2 · axial · 5.0mm · 1.48mm/px · z∈[-31,+221]mm · 2 of 43 slices shown (3 of 3)]
[im 1/43]
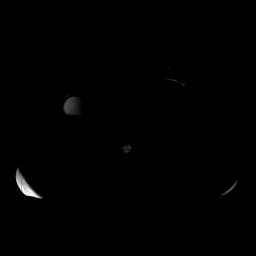
[im 43/43]
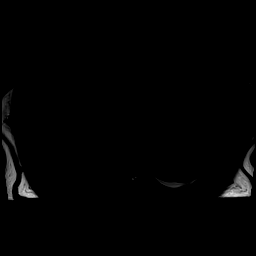

[Series 8: DWI · axial · 5.0mm · 1.42mm/px · z∈[-30,+222]mm · 5 of 129 slices shown (1 of 2)]
[im 1/129]
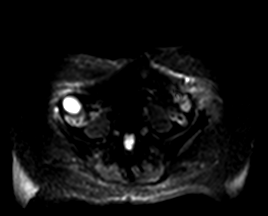
[im 33/129]
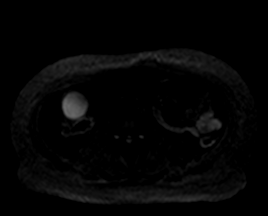
[im 65/129]
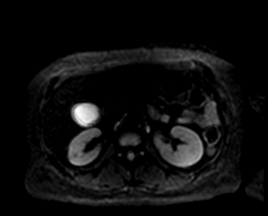
[im 97/129]
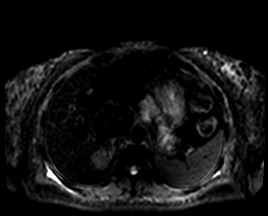
[im 129/129]
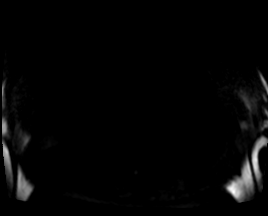

[Series 9: DWI · axial · 5.0mm · 1.42mm/px · z∈[-30,+222]mm · 2 of 43 slices shown (2 of 2)]
[im 1/43]
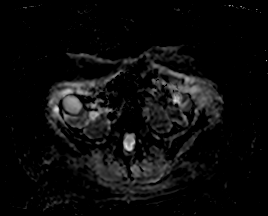
[im 43/43]
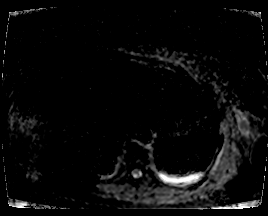

[Series 10: T1 dynamic · axial · non-contrast · 3.0mm · 1.25mm/px · z∈[-53,+208]mm · 3 of 88 slices shown]
[im 1/88]
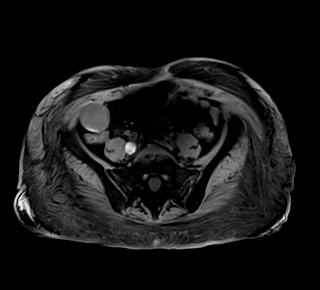
[im 44/88]
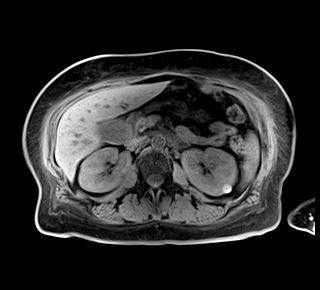
[im 88/88]
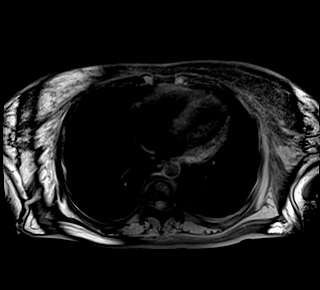

[Series 11: T1 dynamic post-contrast · axial · 3.0mm · 1.25mm/px · z∈[-53,+208]mm · 3 of 88 slices shown (1 of 9)]
[im 1/88]
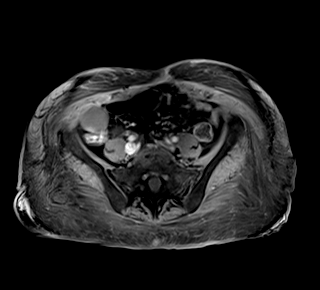
[im 44/88]
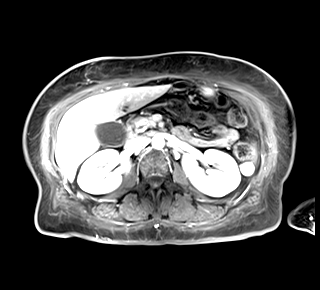
[im 88/88]
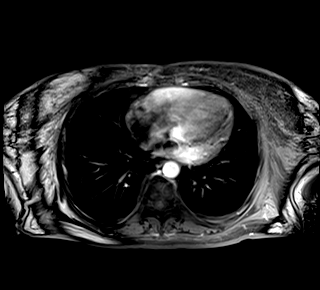

[Series 12: T1 dynamic post-contrast · axial · 3.0mm · 1.25mm/px · z∈[-53,+208]mm · 3 of 88 slices shown (2 of 9)]
[im 1/88]
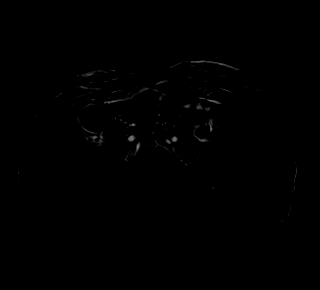
[im 44/88]
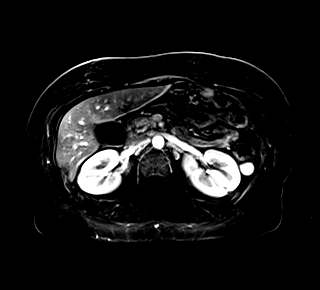
[im 88/88]
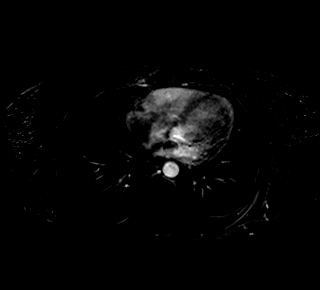

[Series 13: T1 dynamic post-contrast · axial · 3.0mm · 1.25mm/px · z∈[-53,+208]mm · 3 of 88 slices shown (3 of 9)]
[im 1/88]
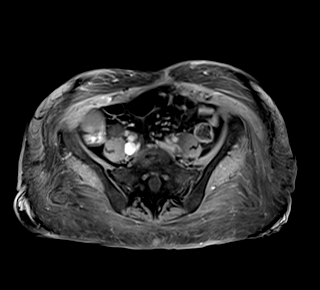
[im 44/88]
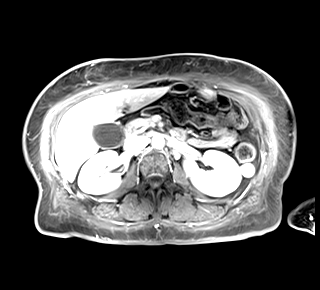
[im 88/88]
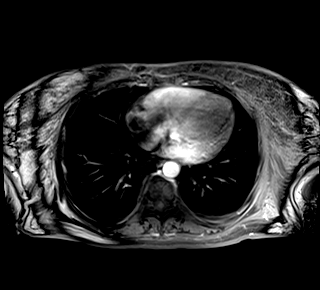

[Series 14: T1 dynamic post-contrast · axial · 3.0mm · 1.25mm/px · z∈[-53,+208]mm · 3 of 88 slices shown (4 of 9)]
[im 1/88]
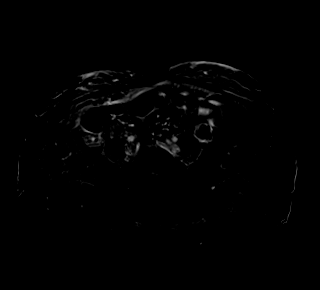
[im 44/88]
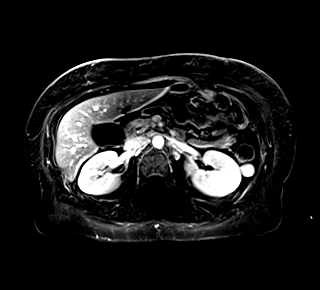
[im 88/88]
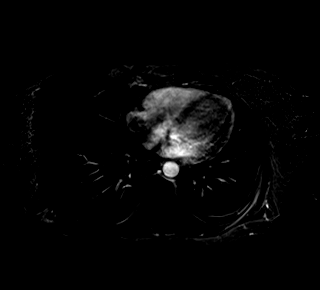

[Series 15: T1 dynamic post-contrast · axial · 3.0mm · 1.25mm/px · z∈[-53,+208]mm · 3 of 88 slices shown (5 of 9)]
[im 1/88]
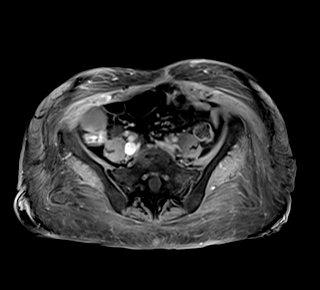
[im 44/88]
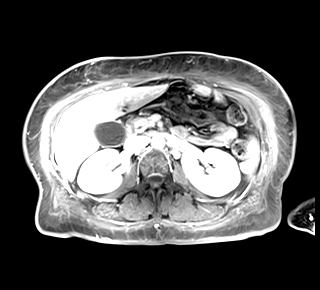
[im 88/88]
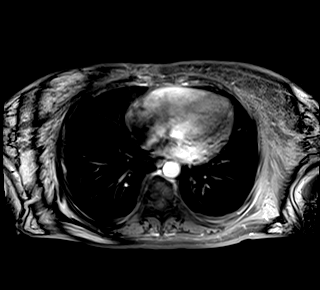

[Series 16: T1 dynamic post-contrast · axial · 3.0mm · 1.25mm/px · z∈[-53,+208]mm · 3 of 88 slices shown (6 of 9)]
[im 1/88]
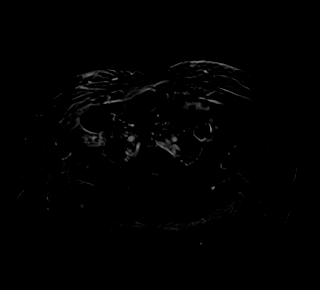
[im 44/88]
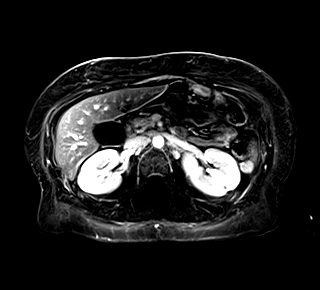
[im 88/88]
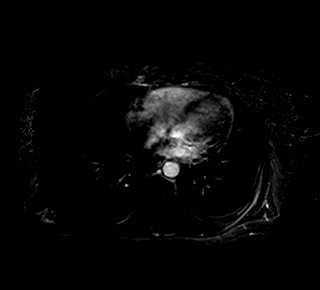

[Series 17: T1 dynamic post-contrast · coronal · 3.0mm · 1.25mm/px · 3 of 72 slices shown (7 of 9)]
[im 1/72]
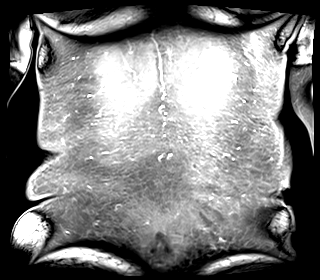
[im 36/72]
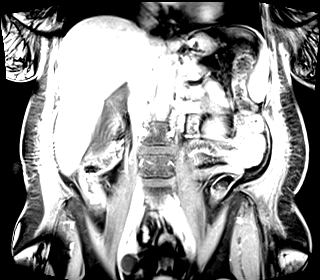
[im 72/72]
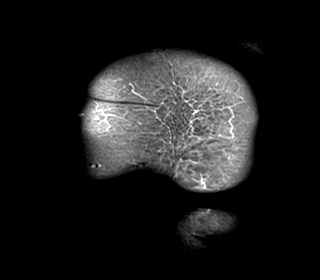

[Series 18: T1 dynamic post-contrast · axial · 3.0mm · 1.25mm/px · z∈[-53,+208]mm · 3 of 88 slices shown (8 of 9)]
[im 1/88]
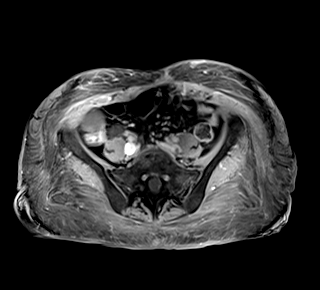
[im 44/88]
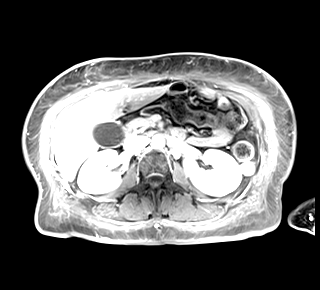
[im 88/88]
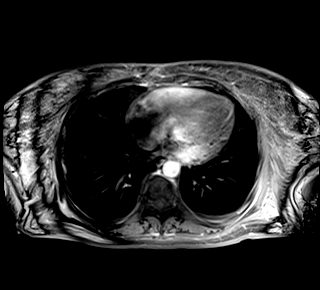

[Series 19: T1 dynamic post-contrast · axial · 3.0mm · 1.25mm/px · z∈[-53,+208]mm · 3 of 88 slices shown (9 of 9)]
[im 1/88]
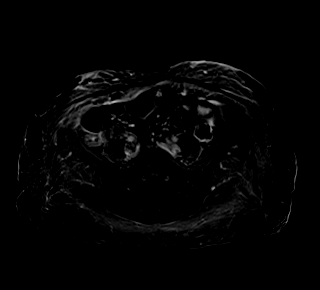
[im 44/88]
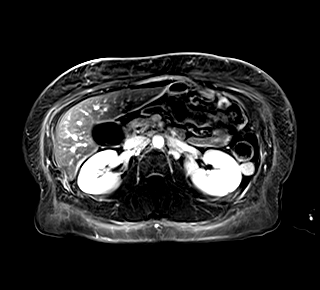
[im 88/88]
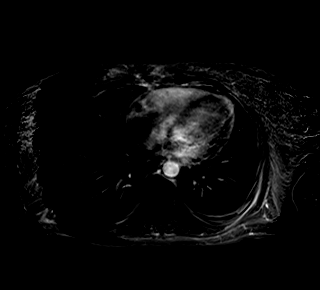

[48 of 48 positions shown; findings below may reference images not displayed]

FINDINGS: Study is limited due to motion.

Lower chest: No acute findings.

Hepatobiliary: Liver is enlarged measuring 19.5 cm in length, with
evidence of hepatic steatosis. There is a 3.3 cm focus of focal
fatty infiltration adjacent to the falciform ligament. Subcentimeter
hemangioma in segment 4. Gallbladder is elongated and moderately
distended with no wall thickening or surrounding edema. Small amount
of dependent likely sludge or tiny calculi in the fundus of the
gallbladder. No biliary ductal dilatation.

Pancreas: No mass, inflammatory changes, or other parenchymal
abnormality identified.

Spleen: Within normal limits in size and appearance. There is a
cm splenule at the inferior aspect of the spleen immediately
adjacent to the kidney, which is consistent with 1 of the structures
visualized ultrasound.

Adrenals/Urinary Tract: Adrenal glands appear normal. 1.2 cm
hemorrhagic cyst in the posterior left kidney. Tiny subcentimeter
simple cyst in the medial left kidney. No additional renal lesions
identified. No hydronephrosis.

Stomach/Bowel: Visualized portions within the abdomen are
unremarkable.

Vascular/Lymphatic: No pathologically enlarged lymph nodes
identified. No abdominal aortic aneurysm demonstrated.

Other:  No ascites.

Musculoskeletal: No suspicious bone lesions identified.
IMPRESSION: 1. 1.2 cm hemorrhagic cyst in the posterior left kidney.
2. There is a splenule which is adjacent to the left kidney,
consistent with 1 of the structures visualized on ultrasound.
3. Hepatomegaly and hepatic steatosis. Prominent area of focal fatty
infiltration in the anterior liver is also noted.
4. Subcentimeter hepatic hemangioma.
5. Small amount of cholelithiasis/sludge.
